# Patient Record
Sex: Male | Born: 1985
Health system: Southern US, Community
[De-identification: ages and names within clinical notes are randomized; demographics above are authoritative.]

## PROBLEM LIST (undated history)

## (undated) DIAGNOSIS — K648 Other hemorrhoids: Secondary | ICD-10-CM

## (undated) DIAGNOSIS — M1990 Unspecified osteoarthritis, unspecified site: Secondary | ICD-10-CM

## (undated) DIAGNOSIS — K649 Unspecified hemorrhoids: Secondary | ICD-10-CM

## (undated) HISTORY — PX: WISDOM TOOTH EXTRACTION: SHX21

## (undated) HISTORY — PX: KNEE SURGERY: SHX244

## (undated) HISTORY — PX: KNEE ARTHROSCOPY WITH ANTERIOR CRUCIATE LIGAMENT (ACL) REPAIR: SHX5644

---
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
## 2003-09-24 ENCOUNTER — Ambulatory Visit (HOSPITAL_BASED_OUTPATIENT_CLINIC_OR_DEPARTMENT_OTHER): Admission: RE | Admit: 2003-09-24 | Discharge: 2003-09-24 | Payer: Self-pay | Admitting: Orthopedic Surgery

## 2003-10-03 ENCOUNTER — Encounter: Admission: RE | Admit: 2003-10-03 | Discharge: 2003-12-26 | Payer: Self-pay | Admitting: Sports Medicine

## 2003-10-13 HISTORY — PX: ANTERIOR CRUCIATE LIGAMENT REPAIR: SHX115

## 2005-07-11 ENCOUNTER — Emergency Department (HOSPITAL_COMMUNITY): Admission: EM | Admit: 2005-07-11 | Discharge: 2005-07-11 | Payer: Self-pay | Admitting: Emergency Medicine

## 2006-03-18 ENCOUNTER — Emergency Department (HOSPITAL_COMMUNITY): Admission: EM | Admit: 2006-03-18 | Discharge: 2006-03-19 | Payer: Self-pay | Admitting: Emergency Medicine

## 2008-04-04 ENCOUNTER — Emergency Department (HOSPITAL_COMMUNITY): Admission: EM | Admit: 2008-04-04 | Discharge: 2008-04-04 | Payer: Self-pay | Admitting: Emergency Medicine

## 2009-10-24 ENCOUNTER — Encounter: Admission: RE | Admit: 2009-10-24 | Discharge: 2009-10-24 | Payer: Self-pay | Admitting: Internal Medicine

## 2011-02-27 NOTE — Op Note (Signed)
NAME:  Connor Castillo, THIER                    ACCOUNT NO.:  [id number]   MEDICAL RECORD NO.:  [id number]                   PATIENT TYPE:  AMB   LOCATION:  DSC                                  FACILITY:  [facility]   PHYSICIAN:  Robert A. Thurston Hole, M.D.              DATE OF BIRTH:  [birth year]   DATE OF PROCEDURE:  09/24/2003  DATE OF DISCHARGE:                                 OPERATIVE REPORT   PREOPERATIVE DIAGNOSIS:  Left knee anterior cruciate ligament tear and  medial meniscus tear.   POSTOPERATIVE DIAGNOSES:  1. Left knee anterior cruciate ligament tear and medial meniscus tear.  2. Left knee partial lateral meniscus tear.   PROCEDURE:  1. Left knee exam under anesthesia, followed  by arthroscopically assisted     endoscopic bone, patellar bone autograft ACL reconstruction using 9 x 25     femoral interference bio screw and 9 x 25 mm tibial interference bio     screw.  2. Left knee partial medial and lateral meniscectomies.   SURGEON:  Elana Alm. Thurston Hole, M.D.   ASSISTANT:  Julien Girt, P.A.   ANESTHESIA:  General.   OPERATIVE TIME:  1 hour and 15 minutes.   COMPLICATIONS:  None.   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 He has had persistent pain and laxity with examination  and MRI documenting an ACL tear and a meniscus tear and is now to undergo  arthroscopy with ACL reconstruction.   DESCRIPTION OF PROCEDURE:  The patient was brought to the operating room on  September 24, 2003, and placed on the operating table in the supine position.  After an adequate level of general anesthesia his left knee was examined  under anesthesia. He had full range of motion, 3+ Lachman, positive pivot  shift and he was stable to varus valgus and posterior stress with normal  patella tracking. The left leg was sterilely injected with 0.25% Marcaine  with  epinephrine. He received 1 gm Ancef IV preoperatively for prophylaxis.  The left leg was prepped using sterile Duraprep and draped using sterile  technique.   Initially the arthroscopy was performed through an inferolateral portal. The  arthroscope with a pump attachment was placed, and through and inferomedial  portal, an arthroscopic probe was placed. On initial inspection of the  medial compartment,  the articular cartilage was normal. The medial meniscus  showed a split loose tear of the posterior  horn in the medial meniscus  which was resected and 50% of the posterior  horn was resected back to a  stable rim.   The intercondylar notch was inspected and the anterior cruciate ligament was  completely torn in its proximal third with significant anterior laxity, and  this was thoroughly debrided and a notchplasty was performed. The posterior  cruciate was intact and stable. The lateral  compartment articular cartilage  was normal. The lateral meniscus showed a small tear posterior  horn 20%  which was resected back to a stable rim. The patellofemoral joint  was  inspected. The articular cartilage in this joint was normal. The patella  tracked normally. The medial and lateral gutters were free of pathology.   After this was done and the  patellar tendon autograft was harvested through  a 5-cm longitudinal incision based over the patellar tendon. Underlying  subcutaneous tissues were incised in line with the skin incision. The  patellar tendon was exposed. It measured 35 mm in width and the central 10  mm was harvested with 10 x 25 mm of  patella bone and tibial tubercle bone.   After  this was done, then through a 1.5-cm anterior medial proximal tibial  incision using a tibial drill guide, a Steinmann pin was drilled into the  ACL insertion point on the tibial plateau and then overdrilled with an 11-mm  drill. Through this hole the posterior  femoral guide was placed into the   posterior femoral notch and a Steinmann pin drilled up in the ACL origin  point and then  overdrilled with a 10-mm drill to a depth of 30 mm.   A double pin passer was then used to be passed through the tibial hole,  through the joint, into the femoral tunnel and  through the femoral cortex  and thigh through a stab wound. This was used to pass the ACL graft up  through the tibial tunnel and joint into the femoral tunnel. It was locked  into position there with a 9 x  25 mm interference bio screw.   The knee was then brought through a full range of motion. There was found to  be no impingement in the graft. The tibial bone plug was then  locked into  its tunnel with a  9 x 25  mm interference bio screw with the knee in 30 degrees of flexion and  the tibia held reduced on the femur. After this was done the knee was tested  for stability. Lachman and pivot shift were found to be totally eliminated  and the knee could be brought through a full range of motion with no  impingement of the graft.   At this point it was felt that all the pathology had been satisfactorily  addressed. The wounds were irrigated and closed using 2-0 Vicryl and 3-0  Prolene. Steri-Strips were applied. Sterile dressings were applied in a long  leg splint. The patient then had a femoral nerve block placed by anesthesia  for postoperative pain control. He was then awakened, extubated and taken to  the recovery room in stable condition. All sponge, instrument and needle  counts were correct at the end of the case.   For follow up care he will be followed as an overnight recovery care patient  for IV pain control and neurovascular monitoring, CPM and ice machine use.  He will be discharged tomorrow on Percocet  and Naprosyn with a home CPM and  ice machine. He will be seen back in my office in one week for sutures out  and follow up.                                              Robert A. Thurston Hole, M.D.    RAW/MEDQ  D:  09/24/2003  T:  09/24/2003  Job:  [id number]

## 2011-02-27 NOTE — Op Note (Signed)
NAME:  Connor Castillo, Connor Castillo                    ACCOUNT NO.:  [id number]   MEDICAL RECORD NO.:  [id number]                   PATIENT TYPE:  AMB   LOCATION:  DSC                                  FACILITY:  [facility]   PHYSICIAN:  Robert A. Thurston Hole, M.D.              DATE OF BIRTH:  09/26/1986   DATE OF PROCEDURE:  09/24/2003  DATE OF DISCHARGE:                                 OPERATIVE REPORT   PREOPERATIVE DIAGNOSIS:  Left knee anterior cruciate ligament tear with  medial meniscal tear.   POSTOPERATIVE DIAGNOSIS:  Left knee anterior cruciate ligament tear with  medial meniscal tear.   PROCEDURE:  1. Left knee examination under anesthesia followed by arthroscopically     assisted endoscopic bone patellar tendon bone Autograft and anterior     cruciate ligament reconstruction using 9 x 25 mm femoral interference bio-     screw and 9 x 25 mm tibial interference bio-screw.  2. Left knee partial medial meniscectomy.   SURGEON:  Elana Alm. Thurston Hole, M.D.   ASSISTANT:  Julien Girt, P.A.   ANESTHESIA:  General.   OPERATIVE TIME:  1 hour and 15 minutes.   COMPLICATIONS:  None.   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  He has had persistent pain, swelling and instability and  he is now to undergo arthroscopy with ACL reconstruction.   DESCRIPTION OF PROCEDURE:  The patient was brought to the operating room on  September 24, 2003 and placed on the operative table in the supine position.  After an adequate level of general anesthesia was obtained, his left knee  was examined under anesthesia.   DICTATION ENDED AT THIS POINT                                               Robert A. Thurston Hole, M.D.    RAW/MEDQ  D:  09/24/2003  T:  09/24/2003  Job:  [id number]

## 2011-04-20 ENCOUNTER — Emergency Department (HOSPITAL_COMMUNITY): Payer: 59

## 2011-04-20 ENCOUNTER — Emergency Department (HOSPITAL_COMMUNITY)
Admission: EM | Admit: 2011-04-20 | Discharge: 2011-04-20 | Disposition: A | Discharge status: Home / Self Care | Payer: 59 | Attending: Emergency Medicine | Admitting: Emergency Medicine

## 2011-04-20 DIAGNOSIS — R079 Chest pain, unspecified: Secondary | ICD-10-CM | POA: Insufficient documentation

## 2011-04-20 LAB — POCT I-STAT, CHEM 8
BUN: 13 mg/dL (ref 6–23)
Calcium, Ion: 1.16 mmol/L (ref 1.12–1.32)
Chloride: 101 mEq/L (ref 96–112)
Creatinine, Ser: 1.3 mg/dL (ref 0.50–1.35)
Glucose, Bld: 90 mg/dL (ref 70–99)
HCT: 42 % (ref 39.0–52.0)
Hemoglobin: 14.3 g/dL (ref 13.0–17.0)
Potassium: 3.8 mEq/L (ref 3.5–5.1)
Sodium: 140 mEq/L (ref 135–145)
TCO2: 27 mmol/L (ref 0–100)

## 2011-04-20 LAB — CBC
HCT: 38.6 % — ABNORMAL LOW (ref 39.0–52.0)
Hemoglobin: 13.3 g/dL (ref 13.0–17.0)
MCH: 29.1 pg (ref 26.0–34.0)
MCHC: 34.5 g/dL (ref 30.0–36.0)
MCV: 84.5 fL (ref 78.0–100.0)
Platelets: 271 10*3/uL (ref 150–400)
RBC: 4.57 MIL/uL (ref 4.22–5.81)
RDW: 12.2 % (ref 11.5–15.5)
WBC: 5.2 10*3/uL (ref 4.0–10.5)

## 2011-04-20 LAB — CK TOTAL AND CKMB (NOT AT ARMC)
CK, MB: 1.6 ng/mL (ref 0.3–4.0)
Relative Index: 1.3 (ref 0.0–2.5)
Total CK: 121 U/L (ref 7–232)

## 2011-04-20 LAB — DIFFERENTIAL
Basophils Absolute: 0.1 10*3/uL (ref 0.0–0.1)
Basophils Relative: 2 % — ABNORMAL HIGH (ref 0–1)
Eosinophils Absolute: 0.2 10*3/uL (ref 0.0–0.7)
Eosinophils Relative: 3 % (ref 0–5)
Lymphocytes Relative: 32 % (ref 12–46)
Lymphs Abs: 1.7 10*3/uL (ref 0.7–4.0)
Monocytes Absolute: 0.6 10*3/uL (ref 0.1–1.0)
Monocytes Relative: 12 % (ref 3–12)
Neutro Abs: 2.7 10*3/uL (ref 1.7–7.7)
Neutrophils Relative %: 52 % (ref 43–77)

## 2011-04-20 LAB — TROPONIN I: Troponin I: <0.3 ng/mL (ref <0.30)

## 2012-11-08 ENCOUNTER — Emergency Department (HOSPITAL_COMMUNITY)
Admission: EM | Admit: 2012-11-08 | Discharge: 2012-11-08 | Disposition: A | Discharge status: Home / Self Care | Payer: Worker's Compensation | Attending: Emergency Medicine | Admitting: Emergency Medicine

## 2012-11-08 ENCOUNTER — Encounter (HOSPITAL_COMMUNITY): Payer: Self-pay | Admitting: *Deleted

## 2012-11-08 DIAGNOSIS — Z23 Encounter for immunization: Secondary | ICD-10-CM | POA: Insufficient documentation

## 2012-11-08 DIAGNOSIS — W2209XA Striking against other stationary object, initial encounter: Secondary | ICD-10-CM | POA: Insufficient documentation

## 2012-11-08 DIAGNOSIS — Y9389 Activity, other specified: Secondary | ICD-10-CM | POA: Insufficient documentation

## 2012-11-08 DIAGNOSIS — Y990 Civilian activity done for income or pay: Secondary | ICD-10-CM | POA: Insufficient documentation

## 2012-11-08 DIAGNOSIS — S01511A Laceration without foreign body of lip, initial encounter: Secondary | ICD-10-CM

## 2012-11-08 DIAGNOSIS — Y9289 Other specified places as the place of occurrence of the external cause: Secondary | ICD-10-CM | POA: Insufficient documentation

## 2012-11-08 DIAGNOSIS — S01501A Unspecified open wound of lip, initial encounter: Secondary | ICD-10-CM | POA: Insufficient documentation

## 2012-11-08 MED ORDER — TETANUS-DIPHTH-ACELL PERTUSSIS 5-2.5-18.5 LF-MCG/0.5 IM SUSP
0.5000 mL | Freq: Once | INTRAMUSCULAR | Status: AC
  Administered 2012-11-08: 0.5 mL via INTRAMUSCULAR
  Filled 2012-11-08: qty 0.5

## 2012-11-08 MED ORDER — ONDANSETRON HCL 4 MG/2ML IJ SOLN
4.0000 mg | Freq: Once | INTRAMUSCULAR | Status: AC
  Administered 2012-11-08: 4 mg via INTRAVENOUS
  Filled 2012-11-08: qty 2

## 2012-11-08 MED ORDER — LIDOCAINE-EPINEPHRINE (PF) 2 %-1:200000 IJ SOLN: 20.0000 mL | Freq: Once | INTRAMUSCULAR | Status: DC

## 2012-11-08 MED ORDER — HYDROMORPHONE HCL PF 1 MG/ML IJ SOLN
1.0000 mg | Freq: Once | INTRAMUSCULAR | Status: AC
  Administered 2012-11-08: 1 mg via INTRAVENOUS
  Filled 2012-11-08: qty 1

## 2012-11-08 MED ORDER — LIDOCAINE-EPINEPHRINE 1 %-1:100000 IJ SOLN
20.0000 mL | Freq: Once | INTRAMUSCULAR | Status: DC
  Filled 2012-11-08: qty 1

## 2012-11-08 MED ORDER — BACITRACIN 500 UNIT/GM EX OINT
1.0000 "application " | TOPICAL_OINTMENT | Freq: Two times a day (BID) | CUTANEOUS | Status: DC
  Filled 2012-11-08 (×2): qty 0.9

## 2012-11-08 NOTE — ED Notes (Signed)
Care transferred and report given to Kelly, RN 

## 2012-11-08 NOTE — ED Provider Notes (Signed)
History     CSN: [id number]  Arrival date & time 11/08/12  0919   First MD Initiated Contact with Patient 11/08/12 [phone]      Chief Complaint  Patient presents with  . Facial Laceration    (Consider location/radiation/quality/duration/timing/severity/associated sxs/prior treatment) HPI  Connor Castillo is a 27 y.o. male complaining of pain and laceration to left upper lip onset this a.m. after being hit with a height at work. Last tetanus unknown, bleeding is now controlled, pain is 6/10, denies tooth pain, difficulty breathing  History reviewed. No pertinent past medical history.  Past Surgical History  Procedure Date  . Knee surgery     No family history on file.  History  Substance Use Topics  . Smoking status: Never Smoker   . Smokeless tobacco: Not on file  . Alcohol Use: No      Review of Systems  Constitutional: Negative for fever.  Respiratory: Negative for shortness of breath.   Cardiovascular: Negative for chest pain.  Gastrointestinal: Negative for nausea, vomiting, abdominal pain and diarrhea.  Skin: Positive for wound.  All other systems reviewed and are negative.    Allergies  Review of patient's allergies indicates no known allergies.  Home Medications  No current outpatient prescriptions on file.  BP 123/68  Pulse 100  Resp 26  Ht 5\' 10"  (1.778 m)  Wt 235 lb (106.595 kg)  BMI 33.72 kg/m2  SpO2 98%  Physical Exam  Nursing note and vitals reviewed. Constitutional: He is oriented to person, place, and time. He appears well-developed and well-nourished. No distress.  HENT:  Head: Normocephalic and atraumatic.  Mouth/Throat: Oropharynx is clear and moist.  Eyes: Conjunctivae normal and EOM are normal. Pupils are equal, round, and reactive to light.  Neck: Normal range of motion.  Cardiovascular: Normal rate.   Pulmonary/Chest: Effort normal. No stridor.  Abdominal: Soft.  Musculoskeletal: Normal range of motion.  Neurological: He  is alert and oriented to person, place, and time.  Skin:       2.5 cm full-thickness laceration to left upper lip that transects the [place] border. She also has a half centimeter laceration to the inner lip this is not appear to be a through and through. Bleeding profusely with no contamination.  Psychiatric: He has a normal mood and affect.    ED Course  Procedures (including critical care time)  Labs Reviewed - No data to display No results found.   1. Lip laceration       MDM  Patient specifically requests a plastic surgeon close the wound. I explained to him that we do not have plastic surgery on-call but [facility] is an option. Patient would like the wound to be closed by surgeon. I will update his tetanus, irrigate the wound.   Consult from Dr. Estella Husk appreciated: He will be happy to evaluate the patient in his office today.  Patient given directions to Dr. Lorine Bears office and advised him to go immediately from the emergency room there. Patient has a friend who can drive him.   Pt verbalized understanding and agrees with care plan. Outpatient follow-up and return precautions given.         Wynetta Emery, PA-C 11/08/12 [phone]

## 2012-11-08 NOTE — ED Notes (Signed)
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 Laceration to left upper lip, approximately 2 inches.

## 2012-11-09 NOTE — ED Provider Notes (Signed)
Medical screening examination/treatment/procedure(s) were performed by non-physician practitioner and as supervising physician I was immediately available for consultation/collaboration.   Gwyneth Sprout, MD 11/09/12 1016

## 2013-11-18 ENCOUNTER — Emergency Department (HOSPITAL_COMMUNITY)
Admission: EM | Admit: 2013-11-18 | Discharge: 2013-11-19 | Disposition: A | Discharge status: Home / Self Care | Payer: 59 | Attending: Emergency Medicine | Admitting: Emergency Medicine

## 2013-11-18 ENCOUNTER — Encounter (HOSPITAL_COMMUNITY): Payer: Self-pay | Admitting: Emergency Medicine

## 2013-11-18 DIAGNOSIS — R5383 Other fatigue: Secondary | ICD-10-CM

## 2013-11-18 DIAGNOSIS — R5381 Other malaise: Secondary | ICD-10-CM | POA: Insufficient documentation

## 2013-11-18 DIAGNOSIS — R197 Diarrhea, unspecified: Secondary | ICD-10-CM | POA: Insufficient documentation

## 2013-11-18 DIAGNOSIS — R509 Fever, unspecified: Secondary | ICD-10-CM | POA: Insufficient documentation

## 2013-11-18 DIAGNOSIS — R112 Nausea with vomiting, unspecified: Secondary | ICD-10-CM | POA: Insufficient documentation

## 2013-11-18 DIAGNOSIS — R109 Unspecified abdominal pain: Secondary | ICD-10-CM

## 2013-11-18 DIAGNOSIS — E871 Hypo-osmolality and hyponatremia: Secondary | ICD-10-CM | POA: Insufficient documentation

## 2013-11-18 DIAGNOSIS — R1084 Generalized abdominal pain: Secondary | ICD-10-CM | POA: Insufficient documentation

## 2013-11-18 LAB — COMPREHENSIVE METABOLIC PANEL
ALK PHOS: 47 U/L (ref 39–117)
ALT: 17 U/L (ref 0–53)
AST: 19 U/L (ref 0–37)
Albumin: 4.1 g/dL (ref 3.5–5.2)
BUN: 16 mg/dL (ref 6–23)
CALCIUM: 8.8 mg/dL (ref 8.4–10.5)
CO2: 26 meq/L (ref 19–32)
Chloride: 98 mEq/L (ref 96–112)
Creatinine, Ser: 1.27 mg/dL (ref 0.50–1.35)
GFR, EST AFRICAN AMERICAN: 87 mL/min — AB (ref >90)
GFR, EST NON AFRICAN AMERICAN: 75 mL/min — AB (ref >90)
GLUCOSE: 117 mg/dL — AB (ref 70–99)
Potassium: 4 mEq/L (ref 3.7–5.3)
SODIUM: 136 meq/L — AB (ref 137–147)
TOTAL PROTEIN: 7.6 g/dL (ref 6.0–8.3)
Total Bilirubin: 1.1 mg/dL (ref 0.3–1.2)

## 2013-11-18 LAB — CBC WITH DIFFERENTIAL/PLATELET
Basophils Absolute: 0 K/uL (ref 0.0–0.1)
Basophils Relative: 0 % (ref 0–1)
Eosinophils Absolute: 0.1 K/uL (ref 0.0–0.7)
Eosinophils Relative: 1 % (ref 0–5)
HCT: 45.6 % (ref 39.0–52.0)
Hemoglobin: 15.7 g/dL (ref 13.0–17.0)
Lymphocytes Relative: 7 % — ABNORMAL LOW (ref 12–46)
Lymphs Abs: 0.7 K/uL (ref 0.7–4.0)
MCH: 29.4 pg (ref 26.0–34.0)
MCHC: 34.4 g/dL (ref 30.0–36.0)
MCV: 85.4 fL (ref 78.0–100.0)
Monocytes Absolute: 0.7 K/uL (ref 0.1–1.0)
Monocytes Relative: 7 % (ref 3–12)
Neutro Abs: 8.5 K/uL — ABNORMAL HIGH (ref 1.7–7.7)
Neutrophils Relative %: 85 % — ABNORMAL HIGH (ref 43–77)
Platelets: ADEQUATE K/uL (ref 150–400)
RBC: 5.34 MIL/uL (ref 4.22–5.81)
RDW: 12.9 % (ref 11.5–15.5)
WBC: 10 K/uL (ref 4.0–10.5)

## 2013-11-18 LAB — LIPASE, BLOOD: LIPASE: 18 U/L (ref 11–59)

## 2013-11-18 MED ORDER — CIPROFLOXACIN HCL 500 MG PO TABS: 500.0000 mg | ORAL_TABLET | Freq: Two times a day (BID) | ORAL | Status: DC

## 2013-11-18 MED ORDER — SODIUM CHLORIDE 0.9 % IV BOLUS (SEPSIS)
1000.0000 mL | Freq: Once | INTRAVENOUS | Status: AC
  Administered 2013-11-18: 1000 mL via INTRAVENOUS

## 2013-11-18 MED ORDER — METRONIDAZOLE 500 MG PO TABS: 500.0000 mg | ORAL_TABLET | Freq: Two times a day (BID) | ORAL | Status: DC

## 2013-11-18 MED ORDER — ONDANSETRON HCL 4 MG/2ML IJ SOLN
4.0000 mg | Freq: Once | INTRAMUSCULAR | Status: AC
  Administered 2013-11-18: 4 mg via INTRAVENOUS
  Filled 2013-11-18: qty 2

## 2013-11-18 MED ORDER — MORPHINE SULFATE 4 MG/ML IJ SOLN
4.0000 mg | Freq: Once | INTRAMUSCULAR | Status: AC
  Administered 2013-11-19: 4 mg via INTRAVENOUS
  Filled 2013-11-18: qty 1

## 2013-11-18 MED ORDER — MORPHINE SULFATE 4 MG/ML IJ SOLN
4.0000 mg | Freq: Once | INTRAMUSCULAR | Status: AC
  Administered 2013-11-18: 4 mg via INTRAVENOUS
  Filled 2013-11-18: qty 1

## 2013-11-18 MED ORDER — ONDANSETRON HCL 4 MG/2ML IJ SOLN
4.0000 mg | Freq: Once | INTRAMUSCULAR | Status: AC
Start: 2013-11-18 — End: 2013-11-19
  Administered 2013-11-19: 4 mg via INTRAVENOUS
  Filled 2013-11-18: qty 2

## 2013-11-18 MED ORDER — PROMETHAZINE HCL 25 MG PO TABS: 25.0000 mg | ORAL_TABLET | Freq: Four times a day (QID) | ORAL | Status: DC | PRN

## 2013-11-18 NOTE — ED Notes (Signed)
Pt arrived to the Ed with a complaint of abdominal pain with associated nausea and vomiting.  Pt states he believes it is food poisoning due to his brother falling ill with the same symptoms as [facility]  Pt pain is located all over his abdomen. Pt used a hot pad with some relief but the pain has persisted.

## 2013-11-18 NOTE — ED Provider Notes (Signed)
Medical screening examination/treatment/procedure(s) were conducted as a shared visit with non-physician practitioner(s) and myself.  I personally evaluated the patient during the encounter.  EKG Interpretation   None      Patient here after having abdominal pain with vomiting [facility] Patient yesterday. No bloody diarrhea. Blood work here is reassuring. All exam is without focal tenderness. Does have a low-grade temperature here and possibly has colitis and will be given prescriptions for treatment of this. No evidence of surgical abdomen at time of discharge  Toy BakerAnthony T Colvin Blatt, MD 11/18/13 2316

## 2013-11-18 NOTE — ED Provider Notes (Signed)
CSN: [id number]     Arrival date & time 11/18/13  2028 History   First MD Initiated Contact with Patient 11/18/13 2151     Chief Complaint  Patient presents with  . Abdominal Pain   (Consider location/radiation/quality/duration/timing/severity/associated sxs/prior Treatment) Patient is a 28 y.o. male presenting with abdominal pain.  Abdominal Pain  28 yo male presents with generalized abdominal pain x 2 days with associated N/V/D. Patient states he ate at [facility] yesterday around 3 pm and then developed abdominal pain about an hour later. Patient describes pain as crampy and sharp intermittent 7-8/10 pain that waxes and wanes. Patient admits to 5 episodes of N/V and about 4 episodes of diarrhea since yesterday. Patient now states he is feeling weak and fatigued. Patient has tried peptobismol without relief. Patient admits to some blood specs in his vomit but denies any bloody stools. Patient denies any urinary sxs. No hx of abdominal surgery. Patient does not smoke or drink alcohol.  History reviewed. No pertinent past medical history. Past Surgical History  Procedure Laterality Date  . Knee surgery     History reviewed. No pertinent family history. History  Substance Use Topics  . Smoking status: Never Smoker   . Smokeless tobacco: Not on file  . Alcohol Use: No    Review of Systems  Gastrointestinal: Positive for abdominal pain.  All other systems reviewed and are negative.    Allergies  Review of patient's allergies indicates no known allergies.  Home Medications   Current Outpatient Rx  Name  Route  Sig  Dispense  Refill  . bismuth subsalicylate (PEPTO BISMOL) 262 MG/15ML suspension   Oral   Take 30 mLs by mouth every 6 (six) hours as needed (upstomach).         . ciprofloxacin (CIPRO) 500 MG tablet   Oral   Take 1 tablet (500 mg total) by mouth 2 (two) times daily. Duration of 5 days   10 tablet   0   . metroNIDAZOLE (FLAGYL) 500 MG tablet   Oral   Take 1  tablet (500 mg total) by mouth 2 (two) times daily. One po bid x 5 days   10 tablet   0   . promethazine (PHENERGAN) 25 MG tablet   Oral   Take 1 tablet (25 mg total) by mouth every 6 (six) hours as needed for nausea or vomiting.   12 tablet   0    BP 115/53  Pulse 75  Temp(Src) 98 F (36.7 C) (Oral)  Resp 15  SpO2 97% Physical Exam  Nursing note and vitals reviewed. Constitutional: He is oriented to person, place, and time. He appears well-developed and well-nourished. No distress.  HENT:  Head: Normocephalic and atraumatic.  Nose: Mucosal edema present.  Mouth/Throat: Uvula is midline, oropharynx is clear and moist and mucous membranes are normal.  Eyes: Conjunctivae and EOM are normal.  Cardiovascular: Normal rate and regular rhythm.  Exam reveals no gallop and no friction rub.   No murmur heard. Pulmonary/Chest: Effort normal and breath sounds normal. No respiratory distress. He has no wheezes. He has no rales.  Abdominal: Soft. Normal appearance. He exhibits no distension. Bowel sounds are increased. There is generalized tenderness. There is no rigidity and no guarding.  Musculoskeletal: Normal range of motion. He exhibits no edema.  Neurological: He is alert and oriented to person, place, and time.  Skin: Skin is warm and dry. He is not diaphoretic.  Psychiatric: He has a normal mood and affect. His  behavior is normal.    ED Course  Procedures (including critical care time) Labs Review Labs Reviewed  CBC WITH DIFFERENTIAL - Abnormal; Notable for the following:    Neutrophils Relative % 85 (*)    Lymphocytes Relative 7 (*)    Neutro Abs 8.5 (*)    All other components within normal limits  COMPREHENSIVE METABOLIC PANEL - Abnormal; Notable for the following:    Sodium 136 (*)    Glucose, Bld 117 (*)    GFR calc non Af Amer 75 (*)    GFR calc Af Amer 87 (*)    All other components within normal limits  LIPASE, BLOOD   Imaging Review No results found.  EKG  Interpretation   None       MDM   1. Abdominal pain   2. Nausea, vomiting, and diarrhea   3. Hyponatremia   4. Low grade fever    Patient with low grade fever of 100.1 and mild tachycardia on presentation. Appears in NAD. Non surgical abdomen on exam. No Leukocytosis Lipase negative  Plan to treat patient's sxs in ED and have patient start outpatient antibiotics for possible colitis, as patient presents with low grade fever and tachycardia.   Patient's pain and nausea markedly improved with treatment in ED. Patient appears stable for discharge. Discussed findings with patient. Patient confirms understanding and agrees with plan.   Meds given in ED:  Medications  sodium chloride 0.9 % bolus 1,000 mL (1,000 mLs Intravenous New Bag/Given 11/18/13 2254)  morphine 4 MG/ML injection 4 mg (4 mg Intravenous Given 11/18/13 2254)  ondansetron (ZOFRAN) injection 4 mg (4 mg Intravenous Given 11/18/13 2254)  morphine 4 MG/ML injection 4 mg (4 mg Intravenous Given 11/19/13 0002)  ondansetron (ZOFRAN) injection 4 mg (4 mg Intravenous Given 11/19/13 0002)    Discharge Medication List as of 11/18/2013 11:32 PM    START taking these medications   Details  ciprofloxacin (CIPRO) 500 MG tablet Take 1 tablet (500 mg total) by mouth 2 (two) times daily. Duration of 5 days, Starting 11/18/2013, Until Discontinued, Print    metroNIDAZOLE (FLAGYL) 500 MG tablet Take 1 tablet (500 mg total) by mouth 2 (two) times daily. One po bid x 5 days, Starting 11/18/2013, Until Discontinued, Print    promethazine (PHENERGAN) 25 MG tablet Take 1 tablet (25 mg total) by mouth every 6 (six) hours as needed for nausea or vomiting., Starting 11/18/2013, Until Discontinued, Print            Allen NorrisJacob Gray Waite HillLackey, PA-C 11/20/13 [phone]

## 2013-11-18 NOTE — Discharge Instructions (Signed)
Take medications as directed. Do not consume alcohol while on antibiotic medications. Drink plenty of fluids to avoid dehydration. Return to ED if your symptoms worsen, you develop fever/chills or become unable to tolerate oral fluids.    Abdominal Pain, Adult Many things can cause abdominal pain. Usually, abdominal pain is not caused by a disease and will improve without treatment. It can often be observed and treated at home. Your health care provider will do a physical exam and possibly order blood tests and X-rays to help determine the seriousness of your pain. However, in many cases, more time must pass before a clear cause of the pain can be found. Before that point, your health care provider may not know if you need more testing or further treatment. HOME CARE INSTRUCTIONS  Monitor your abdominal pain for any changes. The following actions may help to alleviate any discomfort you are experiencing:  Only take over-the-counter or prescription medicines as directed by your health care provider.  Do not take laxatives unless directed to do so by your health care provider.  Try a clear liquid diet (broth, tea, or water) as directed by your health care provider. Slowly move to a bland diet as tolerated. SEEK MEDICAL CARE IF:  You have unexplained abdominal pain.  You have abdominal pain associated with nausea or diarrhea.  You have pain when you urinate or have a bowel movement.  You experience abdominal pain that wakes you in the night.  You have abdominal pain that is worsened or improved by eating food.  You have abdominal pain that is worsened with eating fatty foods. SEEK IMMEDIATE MEDICAL CARE IF:   Your pain does not go away within 2 hours.  You have a fever.  You keep throwing up (vomiting).  Your pain is felt only in portions of the abdomen, such as the right side or the left lower portion of the abdomen.  You pass bloody or black tarry stools. MAKE SURE  YOU:  Understand these instructions.   Will watch your condition.   Will get help right away if you are not doing well or get worse.  Document Released: 07/08/2005 Document Revised: 07/19/2013 Document Reviewed: 06/07/2013 [facility] [employer] Patient Information 2014 [employer] [place].

## 2013-11-22 NOTE — ED Provider Notes (Signed)
Medical screening examination/treatment/procedure(s) were performed by non-physician practitioner and as supervising physician I was immediately available for consultation/collaboration.  EKG Interpretation   None        Toy BakerAnthony T Harris Penton, MD 11/22/13 1108

## 2014-06-04 ENCOUNTER — Other Ambulatory Visit: Payer: Self-pay | Admitting: Family

## 2014-06-04 ENCOUNTER — Ambulatory Visit
Admission: RE | Admit: 2014-06-04 | Discharge: 2014-06-04 | Disposition: A | Discharge status: Home / Self Care | Payer: Worker's Compensation | Source: Ambulatory Visit | Attending: Family | Admitting: Family

## 2014-06-04 DIAGNOSIS — T148XXA Other injury of unspecified body region, initial encounter: Secondary | ICD-10-CM

## 2014-06-04 DIAGNOSIS — R52 Pain, unspecified: Secondary | ICD-10-CM

## 2015-01-09 ENCOUNTER — Encounter: Payer: Self-pay | Admitting: Family Medicine

## 2015-01-23 ENCOUNTER — Ambulatory Visit (INDEPENDENT_AMBULATORY_CARE_PROVIDER_SITE_OTHER): Payer: 59 | Admitting: Physician Assistant

## 2015-01-23 ENCOUNTER — Encounter: Payer: Self-pay | Admitting: Physician Assistant

## 2015-01-23 ENCOUNTER — Other Ambulatory Visit: Payer: Self-pay | Admitting: Physician Assistant

## 2015-01-23 VITALS — BP 118/82 | HR 72 | Temp 99.0°F | Resp 18 | Ht 68.5 in | Wt 232.0 lb

## 2015-01-23 DIAGNOSIS — Z0000 Encounter for general adult medical examination without abnormal findings: Secondary | ICD-10-CM

## 2015-01-23 NOTE — Progress Notes (Signed)
Patient ID: Connor Castillo MRN: [id number], DOB: [birth year] 28 y.o. Date of Encounter: 01/23/2015, 3:35 PM    Chief Complaint: Physical (CPE)  HPI: 29 y.o. y/o AA male here for CPE.   Also, he is being seen as a new patient to establish care at our office. Says his grandmother, Donah Driverarnestine Graves, is a patient here at our office.  He has no complaints or concerns today. States that he has only gone to medical provider for acute illnesses and has never gone for checkup/complete physical exam in many years. Also reports that he has not been diagnosed with any chronicle medical problems.   Review of Systems: Consitutional: No fever, chills, fatigue, night sweats, lymphadenopathy, or weight changes. Eyes: No visual changes, eye redness, or discharge. ENT/Mouth: Ears: No otalgia, tinnitus, hearing loss, discharge. Nose: No congestion, rhinorrhea, sinus pain, or epistaxis. Throat: No sore throat, post nasal drip, or teeth pain. Cardiovascular: No CP, palpitations, diaphoresis, DOE, edema, orthopnea, PND. Respiratory: No cough, hemoptysis, SOB, or wheezing. Gastrointestinal: No anorexia, dysphagia, reflux, pain, nausea, vomiting, hematemesis, diarrhea, constipation, BRBPR, or melena. Genitourinary: No dysuria, frequency, urgency, hematuria, incontinence, nocturia, decreased urinary stream, discharge, impotence, or testicular pain/masses. Musculoskeletal: No decreased ROM, myalgias, stiffness, joint swelling, or weakness. Skin: No rash, erythema, lesion changes, pain, warmth, jaundice, or pruritis. Neurological: No headache, dizziness, syncope, seizures, tremors, memory loss, coordination problems, or paresthesias. Psychological: No anxiety, depression, hallucinations, SI/HI. Endocrine: No fatigue, polydipsia, polyphagia, polyuria, or known diabetes. All other systems were reviewed and are otherwise negative.  History reviewed. No pertinent past medical history.   Past Surgical  History  Procedure Laterality Date  . Knee surgery    . Anterior cruciate ligament repair  2005    Home Meds:  Outpatient Prescriptions Prior to Visit  Medication Sig Dispense Refill  . bismuth subsalicylate (PEPTO BISMOL) 262 MG/15ML suspension Take 30 mLs by mouth every 6 (six) hours as needed (upstomach).    . ciprofloxacin (CIPRO) 500 MG tablet Take 1 tablet (500 mg total) by mouth 2 (two) times daily. Duration of 5 days 10 tablet 0  . metroNIDAZOLE (FLAGYL) 500 MG tablet Take 1 tablet (500 mg total) by mouth 2 (two) times daily. One po bid x 5 days 10 tablet 0  . promethazine (PHENERGAN) 25 MG tablet Take 1 tablet (25 mg total) by mouth every 6 (six) hours as needed for nausea or vomiting. 12 tablet 0   No facility-administered medications prior to visit.    Allergies: No Known Allergies  History   Social History  . Marital Status: Married    Spouse Name: N/A  . Number of Children: N/A  . Years of Education: N/A   Occupational History  . Not on file.   Social History Main Topics  . Smoking status: Never Smoker   . Smokeless tobacco: Never Used  . Alcohol Use: 3.0 oz/week    1 Glasses of wine, 2 Cans of beer, 2 Shots of liquor per week     Comment: per month consumption  . Drug Use: No  . Sexual Activity: Yes   Other Topics Concern  . Not on file   Social History Narrative   Entered 01/23/2015:      Married. Lives with his wife.   Has no children.   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   Is off Friday Saturday Sunday.  Family History  Problem Relation Age of Onset  . Diabetes Mother   . Hypertension Mother     Physical Exam: Blood pressure 118/82, pulse 72, temperature 99 F (37.2 C), temperature source Oral, resp. rate 18, height 5' 8.5" (1.74 m), weight 232 lb (105.235 kg).  General: Well developed, well nourished, AAM. Appears in no  acute distress. HEENT: Normocephalic, atraumatic. Conjunctiva pink, sclera non-icteric. Pupils 2 mm constricting to 1 mm, round, regular, and equally reactive to light and accomodation. EOMI. Internal auditory canal clear. TMs with good cone of light and without pathology. Nasal mucosa pink. Nares are without discharge. No sinus tenderness. Oral mucosa pink. Dentition good. Pharynx without exudate.   Neck: Supple. Trachea midline. No thyromegaly. Full ROM. No lymphadenopathy. Lungs: Clear to auscultation bilaterally without wheezes, rales, or rhonchi. Breathing is of normal effort and unlabored. Cardiovascular: RRR with S1 S2. No murmurs, rubs, or gallops. Distal pulses 2+ symmetrically. No carotid or abdominal bruits. Abdomen: Soft, non-tender, non-distended with normoactive bowel sounds. No hepatosplenomegaly or masses. No rebound/guarding. No CVA tenderness. No hernias. Musculoskeletal: Full range of motion and 5/5 strength throughout. Without swelling, atrophy, tenderness, crepitus, or warmth. Skin: Warm and moist without erythema, ecchymosis, wounds, or rash. Neuro: A+Ox3. CN II-XII grossly intact. Moves all extremities spontaneously. Full sensation throughout. Normal gait. DTR 2+ throughout upper and lower extremities.  Psych:  Responds to questions appropriately with a normal affect.   Assessment/Plan:  29 y.o. y/o AA male here for CPE  -1. Visit for preventive health examination  A. Screening Labs: He is not fasting today but can return fasting Friday morning. Return fasting for lab work. - CBC with Differential/Platelet - COMPLETE METABOLIC PANEL WITH GFR - Lipid panel ---future - TSH - Vit D  25 hydroxy (rtn osteoporosis monitoring)   B. Screening For Prostate Cancer: Given that he is African-American, will need to start this screening at age 28  C. Screening For Colorectal Cancer: No indication to start this until age 33   D.  Immunizations: Flu--------------------------N/A Tetanus----------------- is entered in epic. Patient states that he had this just 1-2 years ago because of work injury. 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 Immunization records are in epic.  Signed:   [address] [place], [place]  01/23/2015 3:35 PM

## 2015-01-24 LAB — CBC WITH DIFFERENTIAL/PLATELET
BASOS ABS: 0 10*3/uL (ref 0.0–0.1)
BASOS PCT: 0 % (ref 0–1)
Eosinophils Absolute: 0.1 10*3/uL (ref 0.0–0.7)
Eosinophils Relative: 2 % (ref 0–5)
HCT: 44.9 % (ref 39.0–52.0)
Hemoglobin: 14.8 g/dL (ref 13.0–17.0)
Lymphocytes Relative: 43 % (ref 12–46)
Lymphs Abs: 2.5 10*3/uL (ref 0.7–4.0)
MCH: 28.7 pg (ref 26.0–34.0)
MCHC: 33 g/dL (ref 30.0–36.0)
MCV: 87 fL (ref 78.0–100.0)
MPV: 9.6 fL (ref 8.6–12.4)
Monocytes Absolute: 0.4 10*3/uL (ref 0.1–1.0)
Monocytes Relative: 7 % (ref 3–12)
NEUTROS ABS: 2.7 10*3/uL (ref 1.7–7.7)
NEUTROS PCT: 48 % (ref 43–77)
PLATELETS: 256 10*3/uL (ref 150–400)
RBC: 5.16 MIL/uL (ref 4.22–5.81)
RDW: 13.4 % (ref 11.5–15.5)
WBC: 5.7 10*3/uL (ref 4.0–10.5)

## 2015-01-24 LAB — COMPLETE METABOLIC PANEL WITH GFR
ALT: 24 U/L (ref 0–53)
AST: 22 U/L (ref 0–37)
Albumin: 4.7 g/dL (ref 3.5–5.2)
Alkaline Phosphatase: 53 U/L (ref 39–117)
BUN: 18 mg/dL (ref 6–23)
CALCIUM: 9.5 mg/dL (ref 8.4–10.5)
CO2: 28 mEq/L (ref 19–32)
CREATININE: 1.23 mg/dL (ref 0.50–1.35)
Chloride: 105 mEq/L (ref 96–112)
GFR, Est African American: >89 mL/min
GFR, Est Non African American: 78 mL/min
Glucose, Bld: 91 mg/dL (ref 70–99)
Potassium: 4 mEq/L (ref 3.5–5.3)
Sodium: 139 mEq/L (ref 135–145)
TOTAL PROTEIN: 7.4 g/dL (ref 6.0–8.3)
Total Bilirubin: 0.9 mg/dL (ref 0.2–1.2)

## 2015-01-24 LAB — TSH: TSH: 0.151 u[IU]/mL — ABNORMAL LOW (ref 0.350–4.500)

## 2015-01-24 LAB — T4, FREE: Free T4: 1.13 ng/dL (ref 0.80–1.80)

## 2015-01-24 LAB — VITAMIN D 25 HYDROXY (VIT D DEFICIENCY, FRACTURES): Vit D, 25-Hydroxy: 31 ng/mL (ref 30–100)

## 2015-01-25 ENCOUNTER — Other Ambulatory Visit: Payer: 59

## 2015-01-25 DIAGNOSIS — Z0000 Encounter for general adult medical examination without abnormal findings: Secondary | ICD-10-CM

## 2015-01-25 LAB — LIPID PANEL
CHOL/HDL RATIO: 4.3 ratio
Cholesterol: 192 mg/dL (ref 0–200)
HDL: 45 mg/dL (ref >=40)
LDL Cholesterol: 139 mg/dL — ABNORMAL HIGH (ref 0–99)
Triglycerides: 39 mg/dL (ref <150)
VLDL: 8 mg/dL (ref 0–40)

## 2015-01-29 ENCOUNTER — Other Ambulatory Visit: Payer: Self-pay | Admitting: Family Medicine

## 2015-01-29 DIAGNOSIS — R7989 Other specified abnormal findings of blood chemistry: Secondary | ICD-10-CM

## 2015-02-25 ENCOUNTER — Encounter (HOSPITAL_COMMUNITY): Payer: Self-pay | Admitting: Emergency Medicine

## 2015-02-25 ENCOUNTER — Emergency Department (HOSPITAL_COMMUNITY)
Admission: EM | Admit: 2015-02-25 | Discharge: 2015-02-26 | Disposition: A | Discharge status: Home / Self Care | Payer: 59 | Attending: Emergency Medicine | Admitting: Emergency Medicine

## 2015-02-25 DIAGNOSIS — K648 Other hemorrhoids: Secondary | ICD-10-CM | POA: Diagnosis not present

## 2015-02-25 DIAGNOSIS — K649 Unspecified hemorrhoids: Secondary | ICD-10-CM | POA: Diagnosis present

## 2015-02-25 HISTORY — DX: Unspecified hemorrhoids: K64.9

## 2015-02-25 MED ORDER — LIDOCAINE HCL 2 % EX GEL
1.0000 "application " | Freq: Once | CUTANEOUS | Status: AC
  Administered 2015-02-25: 1 via TOPICAL
  Filled 2015-02-25: qty 20

## 2015-02-25 MED ORDER — DIAZEPAM 5 MG PO TABS
5.0000 mg | ORAL_TABLET | Freq: Once | ORAL | Status: AC
  Administered 2015-02-25: 5 mg via ORAL
  Filled 2015-02-25: qty 1

## 2015-02-25 NOTE — ED Notes (Signed)
Pt. reports painful hemorrhoid onset yesterday unrelieved by OTC Preparation H .

## 2015-02-26 ENCOUNTER — Telehealth: Payer: Self-pay | Admitting: Family Medicine

## 2015-02-26 ENCOUNTER — Encounter: Payer: Self-pay | Admitting: Family Medicine

## 2015-02-26 ENCOUNTER — Ambulatory Visit (INDEPENDENT_AMBULATORY_CARE_PROVIDER_SITE_OTHER): Payer: 59 | Admitting: Family Medicine

## 2015-02-26 VITALS — BP 136/70 | HR 88 | Temp 98.5°F | Resp 16 | Ht 68.5 in | Wt 233.0 lb

## 2015-02-26 DIAGNOSIS — K648 Other hemorrhoids: Secondary | ICD-10-CM

## 2015-02-26 MED ORDER — HYDROCORTISONE 2.5 % RE CREA: 1.0000 "application " | TOPICAL_CREAM | Freq: Two times a day (BID) | RECTAL | Status: DC

## 2015-02-26 MED ORDER — PSYLLIUM 28 % PO PACK: 1.0000 | PACK | Freq: Two times a day (BID) | ORAL | Status: DC

## 2015-02-26 MED ORDER — LIDOCAINE HCL 2 % EX GEL: 1.0000 "application " | Freq: Four times a day (QID) | CUTANEOUS | Status: DC | PRN

## 2015-02-26 NOTE — Progress Notes (Signed)
Patient ID: Connor HaloChristopher N Castillo, male   DOB: [birth year], 29 y.o.   MRN: [id number]   Subjective:    Patient ID: Connor HaloChristopher N Castillo, male    DOB: [birth year], 29 y.o.   MRN: [id number]  Patient presents for Prolapsed Hemorrhoids  patient here with prolapsed hemorrhoids. He was seen in the emergency room yesterday. They did try to reduce the hemorrhoids but they're now back out and quite painful. He has history of hemorrhoids as well as some straining and constipation. They given a prescription for Metamucil as well as lidocaine jelly he has not picked these up. He is unable to work he works for the [place] the [employer] department he has to do a lot of lifting typically 20-50 pounds. He's been doing sitz baths multiple times a day which helps some.    Review Of Systems: PER ABOVE  GEN- denies fatigue, fever, weight loss,weakness, recent illness HEENT- denies eye drainage, change in vision, nasal discharge, CVS- denies chest pain, palpitations RESP- denies SOB, cough, wheeze ABD- denies N/V, change in stools, abd pain GU- denies dysuria, hematuria, dribbling, incontinence MSK- denies joint pain, muscle aches, injury Neuro- denies headache, dizziness, syncope, seizure activity       Objective:    BP 136/70 mmHg  Pulse 88  Temp(Src) 98.5 F (36.9 C) (Oral)  Resp 16  Ht 5' 8.5" (1.74 m)  Wt 233 lb (105.688 kg)  BMI 34.91 kg/m2 GEN- NAD, alert and oriented x3 Rectum- large prolpased internal hemorroids, no active bleeding, TTP, unable to reduce        Assessment & Plan:      Problem List Items Addressed This Visit    None    Visit Diagnoses    Prolapsed internal hemorrhoids    -  Primary    Discussed with surgery, due to severe inflammation no intervention for 2 weeks,out of work, sitz bath, anusol, lidocaine, bowel regimen        Note: This dictation was prepared with Nurse, children'sDragon dictation along with smaller Lobbyistphrase technology. Any transcriptional errors that result  from this process are unintentional.

## 2015-02-26 NOTE — Telephone Encounter (Signed)
Salley ScarletKawanta F Skidmore, MD at 02/26/2015 11:56 AM     Status: Signed       Expand All Collapse All   I called pt, I spoke with Dr. Marykay Lexhomspn at [facility], they do not recommend doing anything surgically to the prolapse hemorroids while inflammed because of risk of anal stenosis, so not to try to reduce them at this time. They will call and schedule him an appt, but he will have to wait 2 weeks, unless he starts having bleeding. Sitz baths three times a day Use the lidocaine jelly like we discussed Also, instead of metamucil pick up Miralax 1 cap full daily to keep bowels soft  I also sent in Anusol steroid cream- they recommend twice a day as well        Pt called and I told him above information.

## 2015-02-26 NOTE — Telephone Encounter (Signed)
I called pt, I spoke with Dr. Marykay Lexhomspn at [facility], they do not recommend doing anything surgically to the prolapse hemorroids while inflammed because of risk of anal stenosis, so not to try to reduce them at this time. They will call and schedule him an appt, but he will have to wait 2 weeks, unless he starts having bleeding. Sitz baths three times a day Use the lidocaine jelly like we discussed Also, instead of metamucil pick up Miralax 1 cap full daily to keep bowels soft  I also sent in Anusol steroid cream- they recommend twice a day as well

## 2015-02-26 NOTE — Patient Instructions (Addendum)
[facility]- appointment to be made Continue sitz baths Get the lidocaine Jelly prescription F/U pending

## 2015-02-26 NOTE — Discharge Instructions (Signed)

## 2015-03-05 ENCOUNTER — Telehealth: Payer: Self-pay | Admitting: Physician Assistant

## 2015-03-05 NOTE — Telephone Encounter (Signed)
[phone] Pt dropped off FMLA ppw to be filled out I have routed it to [place]

## 2015-03-06 NOTE — Telephone Encounter (Signed)
Received FMLA ppw on my desk from [employer] [place]  Pt Job title is Designer, industrial/productCrew Member at [employer]  Work Schedule 10 hour day/4 days week  Pt states he is needing FMLA because he has hemorroids and is not able to work comfortably and is in a lot of pain. States he needs surgery on and his body needs to time to heal up.  Pt saw Dr.Stephen Juleen ChinaKohut on 02/25/15 and has appt scheduled with [facility] on June 1 at 2:50pm.  I have routed the FMLA ppw to Dr. Jeanice Limurham and placed in blue folder and filled in Section III of provider section.

## 2015-03-06 NOTE — Telephone Encounter (Signed)
Is he still out of work at this time

## 2015-03-06 NOTE — Telephone Encounter (Signed)
Yes Ma'am. °

## 2015-03-08 NOTE — Telephone Encounter (Signed)
Pt wife was checking on status of husbands FMLA and she stated that they need to have it by 03/27/2015

## 2015-03-12 NOTE — ED Provider Notes (Signed)
CSN: [id number]     Arrival date & time 02/25/15  2222 History   First MD Initiated Contact with Patient 02/25/15 2328     Chief Complaint  Patient presents with  . Hemorrhoids     (Consider location/radiation/quality/duration/timing/severity/associated sxs/prior Treatment) HPI   28yM with painful hemorrhoids. HAs had them intermittently. Since yesterday very painful. No bleeding. Has tried preparation H with little relief. No fever or chills. No abdominal pain.   Past Medical History  Diagnosis Date  . Hemorrhoid    Past Surgical History  Procedure Laterality Date  . Knee surgery    . Anterior cruciate ligament repair  2005   Family History  Problem Relation Age of Onset  . Diabetes Mother   . Hypertension Mother    History  Substance Use Topics  . Smoking status: Never Smoker   . Smokeless tobacco: Never Used  . Alcohol Use: Yes     Comment: per month consumption    Review of Systems  All systems reviewed and negative, other than as noted in HPI.   Allergies  Review of patient's allergies indicates no known allergies.  Home Medications   Prior to Admission medications   Medication Sig Start Date End Date Taking? Authorizing Provider  hydrocortisone (ANUSOL-HC) 2.5 % rectal cream Place 1 application rectally 2 (two) times daily. 02/26/15   Salley ScarletKawanta F Acme, MD  lidocaine (XYLOCAINE) 2 % jelly Apply 1 application topically 4 (four) times daily as needed. Patient not taking: Reported on 02/26/2015 02/26/15   Raeford RazorStephen Elverna Caffee, MD  psyllium (METAMUCIL SMOOTH TEXTURE) 28 % packet Take 1 packet by mouth 2 (two) times daily. Patient not taking: Reported on 02/26/2015 02/26/15   Raeford RazorStephen Calogero Geisen, MD   BP 135/83 mmHg  Pulse 63  Temp(Src) 97.3 F (36.3 C) (Oral)  Resp 22  Ht 5\' 10"  (1.778 m)  Wt 237 lb 2 oz (107.559 kg)  BMI 34.02 kg/m2  SpO2 98% Physical Exam  Constitutional: He appears well-developed and well-nourished. No distress.  HENT:  Head: Normocephalic and  atraumatic.  Eyes: Conjunctivae are normal. Right eye exhibits no discharge. Left eye exhibits no discharge.  Neck: Neck supple.  Cardiovascular: Normal rate, regular rhythm and normal heart sounds.  Exam reveals no gallop and no friction rub.   No murmur heard. Pulmonary/Chest: Effort normal and breath sounds normal. No respiratory distress.  Abdominal: Soft. He exhibits no distension. There is no tenderness.  Genitourinary:  Prolapse internal hemorrhoid. Non thrombosed. Non bleeding. Able to reduce with gentle pressure.   Musculoskeletal: He exhibits no edema or tenderness.  Neurological: He is alert.  Skin: Skin is warm and dry.  Psychiatric: He has a normal mood and affect. His behavior is normal. Thought content normal.  Nursing note and vitals reviewed.   ED Course  Procedures (including critical care time) Labs Review Labs Reviewed - No data to display  Imaging Review No results found.   EKG Interpretation None      MDM   Final diagnoses:  Prolapsed internal hemorrhoids    Sitz baths, bulking agent, lidocaine jelly. General surgical FU.     Raeford RazorStephen Saran Laviolette, MD 03/12/15 [phone]

## 2015-03-12 NOTE — Telephone Encounter (Signed)
Called pt this morning and informed forms are ready and needs to pay the 20 dollars fee and will fax to pt's employer once receive pymt. Pt states will come up to office today to pay

## 2016-05-21 ENCOUNTER — Other Ambulatory Visit: Payer: Self-pay | Admitting: Occupational Medicine

## 2016-05-21 ENCOUNTER — Ambulatory Visit: Payer: Self-pay

## 2016-05-21 DIAGNOSIS — M79671 Pain in right foot: Secondary | ICD-10-CM

## 2016-11-22 ENCOUNTER — Encounter (HOSPITAL_COMMUNITY): Payer: Self-pay

## 2016-11-22 ENCOUNTER — Emergency Department (HOSPITAL_COMMUNITY)
Admission: EM | Admit: 2016-11-22 | Discharge: 2016-11-22 | Disposition: A | Discharge status: Home / Self Care | Payer: Commercial Managed Care - HMO | Attending: Emergency Medicine | Admitting: Emergency Medicine

## 2016-11-22 DIAGNOSIS — Y939 Activity, unspecified: Secondary | ICD-10-CM | POA: Insufficient documentation

## 2016-11-22 DIAGNOSIS — Z79899 Other long term (current) drug therapy: Secondary | ICD-10-CM | POA: Insufficient documentation

## 2016-11-22 DIAGNOSIS — Y999 Unspecified external cause status: Secondary | ICD-10-CM | POA: Diagnosis not present

## 2016-11-22 DIAGNOSIS — M25512 Pain in left shoulder: Secondary | ICD-10-CM | POA: Diagnosis present

## 2016-11-22 DIAGNOSIS — Y92410 Unspecified street and highway as the place of occurrence of the external cause: Secondary | ICD-10-CM | POA: Diagnosis not present

## 2016-11-22 NOTE — Discharge Instructions (Signed)
Please read attached information. If you experience any new or worsening signs or symptoms please return to the emergency room for evaluation. Please follow-up with your primary care provider or specialist as discussed. Please use medication prescribed only as directed and discontinue taking if you have any concerning signs or symptoms.   °
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
## 2017-01-14 ENCOUNTER — Telehealth: Payer: Self-pay | Admitting: Emergency Medicine

## 2017-01-14 NOTE — Telephone Encounter (Deleted)
Patient called and expressed that he needed to get a new script for his CPAP machine supplies.  I informed Darl Pikes and she authorized a year refill for his supplies.  I faxed the script to [facility]. Melissa from [facility] said once they received the script they will contact the patient to set up a delivery time to send his supplies.

## 2017-08-19 ENCOUNTER — Ambulatory Visit: Payer: 59 | Admitting: Physician Assistant

## 2017-08-19 ENCOUNTER — Encounter: Payer: Self-pay | Admitting: Physician Assistant

## 2017-08-19 ENCOUNTER — Other Ambulatory Visit: Payer: Self-pay

## 2017-08-19 VITALS — BP 122/88 | HR 89 | Temp 98.1°F | Resp 16 | Wt 246.8 lb

## 2017-08-19 DIAGNOSIS — F4310 Post-traumatic stress disorder, unspecified: Secondary | ICD-10-CM

## 2017-08-19 DIAGNOSIS — F4321 Adjustment disorder with depressed mood: Secondary | ICD-10-CM | POA: Diagnosis not present

## 2017-08-19 NOTE — Progress Notes (Signed)
Patient ID: Connor Castillo MRN: [id number], DOB: 10-29-1985, 31 y.o. Date of Encounter: 08/19/2017, 4:43 PM    Chief Complaint:  Chief Complaint  Patient presents with  . Dealing with the lost of co-worker     HPI: [age] y.o. year old male presents with above.   He works with [employer].  Reports that last week, on that Thursday, which would have been November 1, he and several coworkers climbed up the side of the water tower and then spent time on top of the [employer] "in the bowl "and then climbed back down using harnesses.  Reports that one of the coworkers, who was above him on the ladder when they were coming down, died. ( I don't know if there was a malfunction of the equipment or what).  Just tells me that he heard some clicking noises and the next thing he knew, there was a louder noise and then realized that she had fallen to the ground and died.  With him today, he has his cell phone.  There is a video recording of him at the water tower prior to the climb up.  With him today, he also has carbon copy of his report that he had to turn and to detectives, police. He reports that he had to answer questioning with detectives and police.  He reports that he keeps hearing the clanking and banging sounds that he heard that day-- over and over in his head. He reports that he keeps thinking back through the questioning from the police and the detectives. 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   He does not feel like he is in a good mental state to be able to completely focus on his job.   Concerned that this could affect the safety of himself and others as well as some doing quality work. He also is concerned about being at work around coworkers constantly asking him questions about what happened.  Reports  that he has talked with a counselor and that was covered through his job. He reports that he is currently on administrative leave through Monday as they are [facility] Monday [facility].   However he feels that he needs more time away from work to mentally and emotionally handle this.   Concerned about being in deep holes or in areas where he will hear that similar clanking and banging sound or in situations where he needs to be able to completely focus. He feels that he needs some time away from being around these coworkers reminders of the event that day.       Home Meds:   Outpatient Medications Prior to Visit  Medication Sig Dispense Refill  . hydrocortisone (ANUSOL-HC) 2.5 % rectal cream Place 1 application rectally 2 (two) times daily. 30 g 2  . lidocaine (XYLOCAINE) 2 % jelly Apply 1 application topically 4 (four) times daily as needed. (Patient not taking: Reported on 02/26/2015) 30 mL 0  . psyllium (METAMUCIL SMOOTH TEXTURE) 28 % packet Take 1 packet by mouth 2 (two) times daily. (Patient not taking: Reported on 02/26/2015) 30 packet 0   No facility-administered medications prior to visit.     Allergies: No Known Allergies    Review of Systems: See HPI for pertinent ROS. All other ROS negative.  Physical Exam: Blood pressure 122/88, pulse 89, temperature 98.1 F (36.7 C), temperature source Oral, resp. rate 16, weight 111.9 kg (246 lb 12.8 oz), SpO2 98 %., Body mass index is 35.41 kg/m. General:  WNWD AAM. Appears in no acute distress. Neck: Supple. No thyromegaly. No lymphadenopathy. Lungs: Clear bilaterally to auscultation without wheezes, rales, or rhonchi. Breathing is unlabored. Heart: Regular rhythm. No murmurs, rubs, or gallops. Msk:  Strength and tone normal for age. Extremities/Skin: Warm and dry. Neuro: Alert and oriented X 3. Moves all extremities spontaneously. Gait is normal. CNII-XII grossly in tact. Psych:  Responds to questions  appropriately with a normal affect.     ASSESSMENT AND PLAN:  31 y.o. year old male with  1. Grief reaction  2. PTSD (post-traumatic stress disorder)  Note given for him to be out of work through Monday, November 26.   He will follow-up with me at office visit Monday, November 26 to determine if he feels stable to return to work.  [address]igned, Mary Beth East Griffin[place], [place], [facility] 08/19/2017 4:43 PM

## 2017-09-03 IMAGING — CR DG FOOT COMPLETE 3+V*R*
3 series · 3 of 3 positions shown · non-contrast
Comparison: None.

CLINICAL DATA: Blunt trauma to the dorsum of the foot today with
pain

EXAM:
RIGHT FOOT COMPLETE - 3+ VIEW

[view not recorded (1 of 3)]
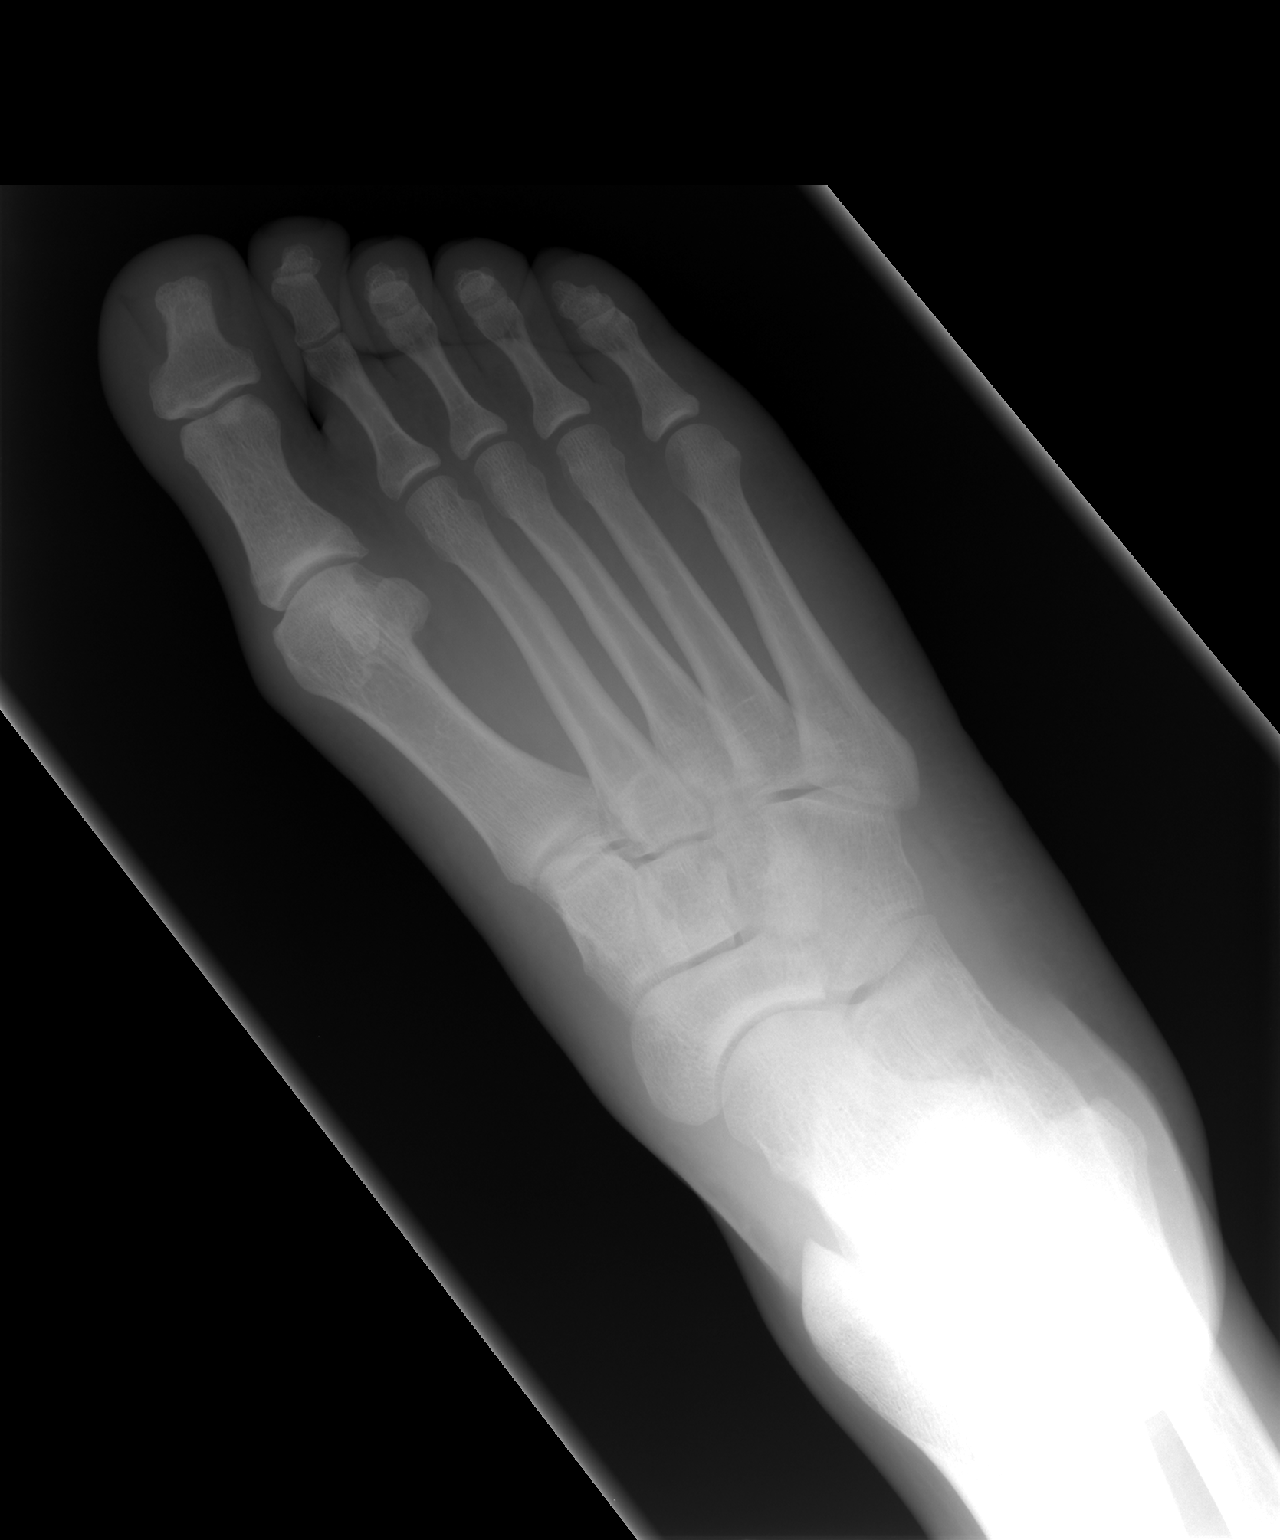

[view not recorded (2 of 3)]
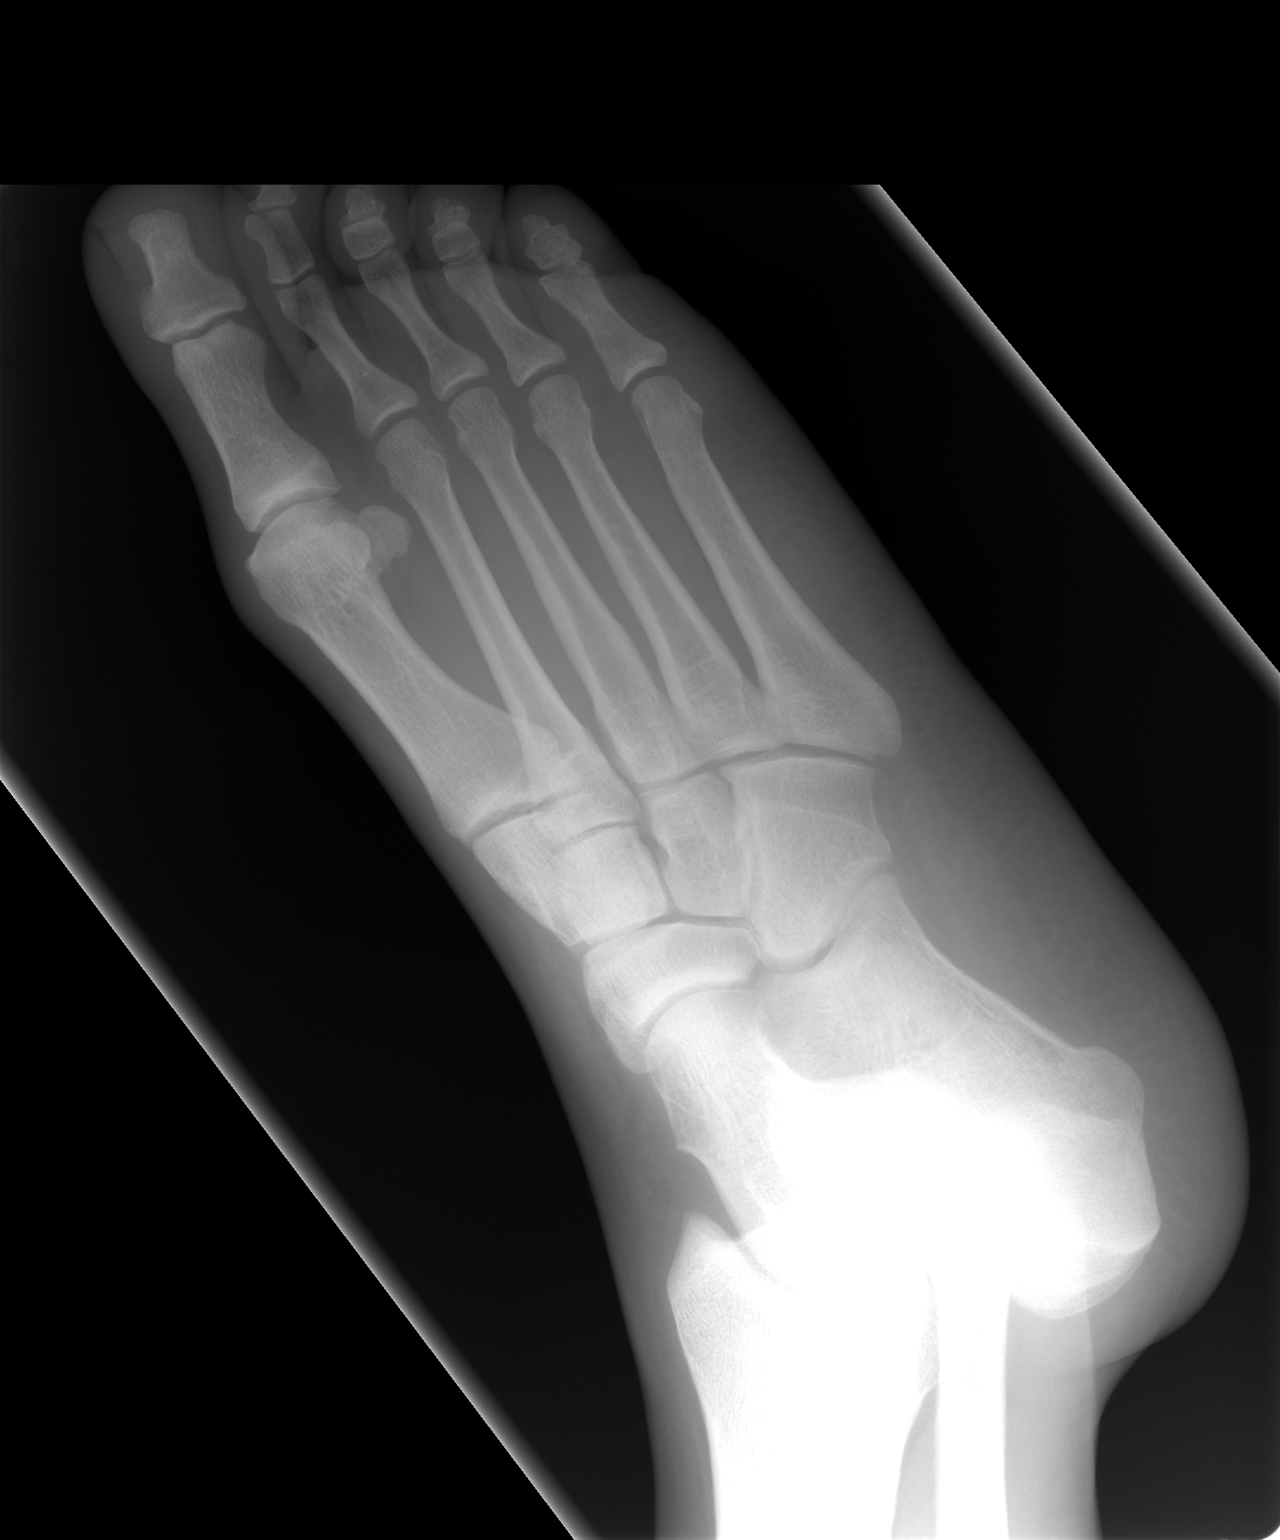

[view not recorded (3 of 3)]
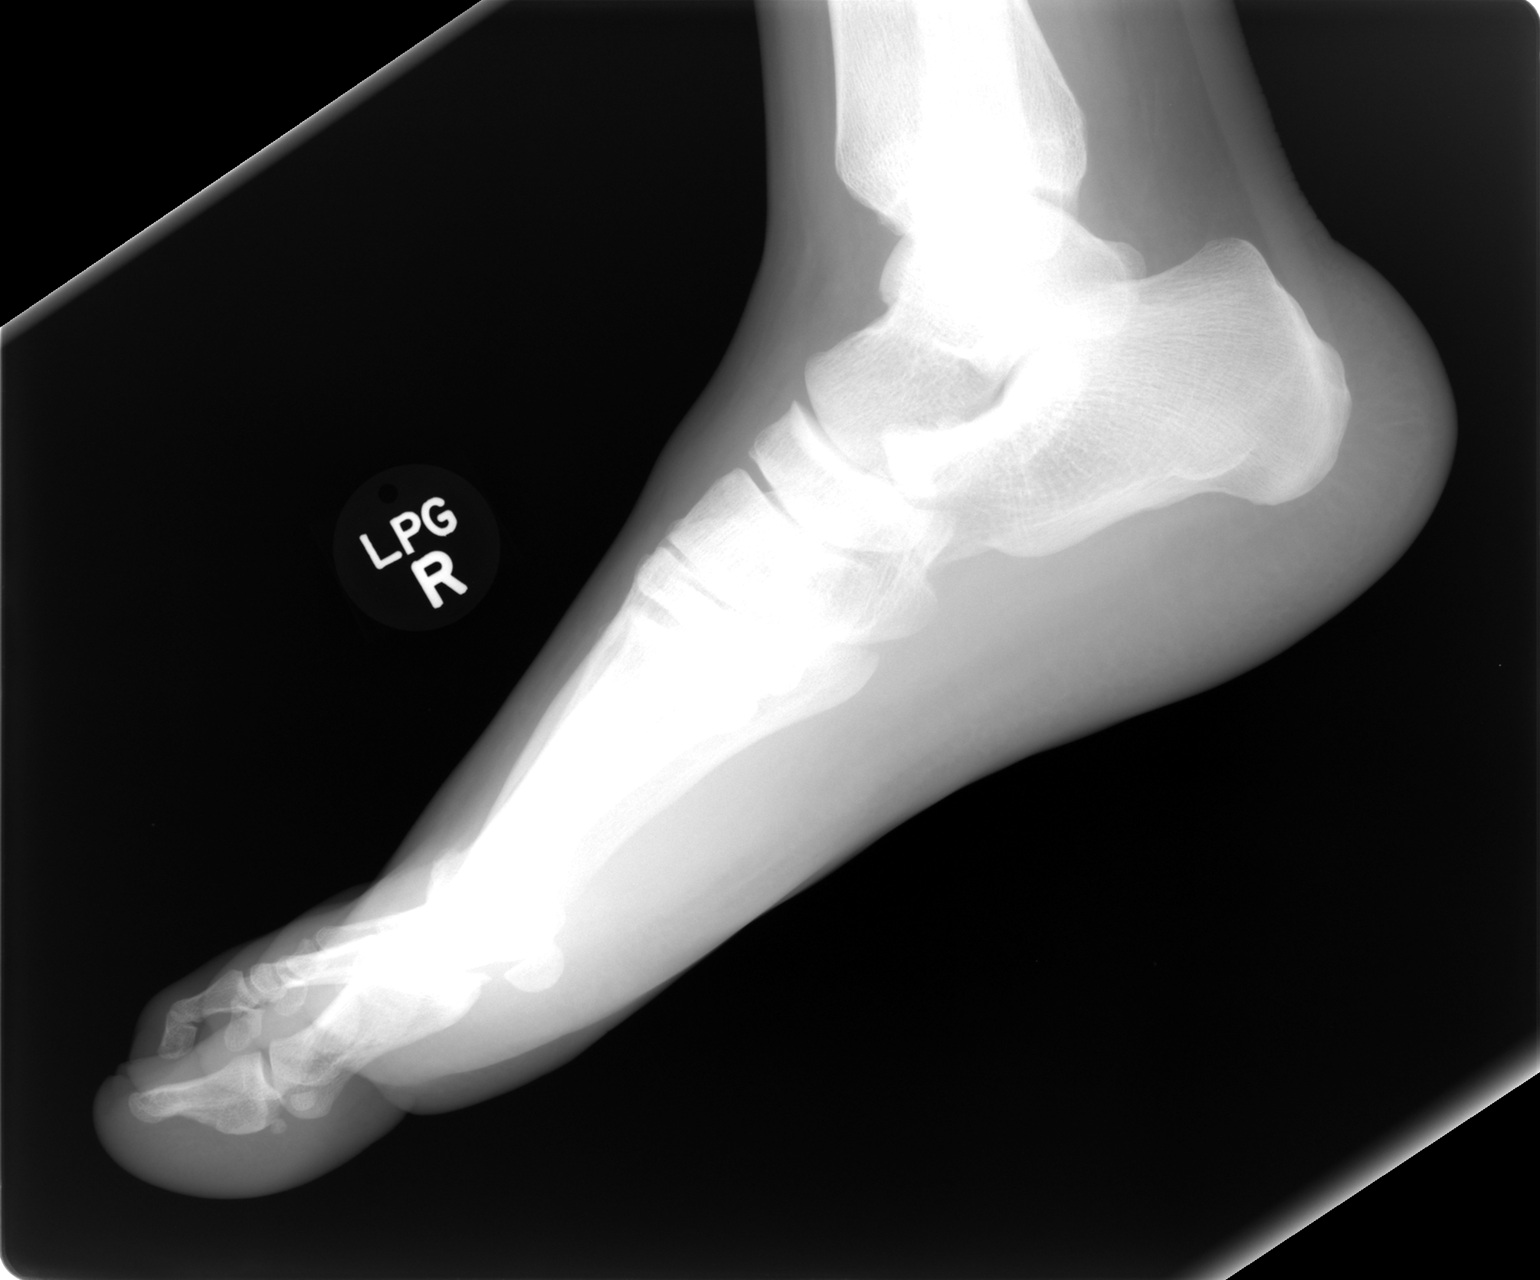

[3 of 3 positions shown; findings below may reference images not displayed]

FINDINGS: Tarsal-metatarsal alignment is normal. No fracture is seen. Joint
spaces appear normal.
IMPRESSION: Negative.

## 2017-09-06 ENCOUNTER — Encounter: Payer: Self-pay | Admitting: Physician Assistant

## 2017-09-06 ENCOUNTER — Other Ambulatory Visit: Payer: Self-pay

## 2017-09-06 ENCOUNTER — Ambulatory Visit: Payer: 59 | Admitting: Physician Assistant

## 2017-09-06 VITALS — BP 118/88 | HR 111 | Temp 98.5°F | Resp 16 | Ht 70.0 in | Wt 251.6 lb

## 2017-09-06 DIAGNOSIS — F4321 Adjustment disorder with depressed mood: Secondary | ICD-10-CM

## 2017-09-06 NOTE — Progress Notes (Signed)
Patient ID: Connor Castillo MRN: [id number], DOB: [birth year], 31 y.o. Date of Encounter: 09/06/2017, 2:31 PM    Chief Complaint:  Chief Complaint  Patient presents with  . routine follow up office visit     HPI: 31 y.o. year old male presents with above.   08/19/2017: He works with [employer]  Reports that last week, on that Thursday, which would have been November 1, he and several coworkers climbed up the side of the water tower and then spent time on top of the [employer] "in the bowl "and then climbed back down using harnesses.  Reports that one of the coworkers, who was above him on the ladder when they were coming down, died. ( I don't know if there was a malfunction of the equipment or what).  Just tells me that he heard some clicking noises and the next thing he knew, there was a louder noise and then realized that she had fallen to the ground and died.  With him today, he has his cell phone.  There is a video recording of him at the water tower prior to the climb up.  With him today, he also has carbon copy of his report that he had to turn and to detectives, police. He reports that he had to answer questioning with detectives and police.  He reports that he keeps hearing the clanking and banging sounds that he heard that day-- over and over in his head. He reports that he keeps thinking back through the questioning from the police and the detectives. 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   He does not feel like he is in a good mental state to be able to completely focus on his job.   Concerned that this could affect the safety of himself and others as well as some doing quality work. He also is concerned about being at work around coworkers constantly asking him questions about what  happened.  Reports that he has talked with a counselor and that was covered through his job. He reports that he is currently on administrative leave through Monday as they are already off Monday through for [facility].   However he feels that he needs more time away from work to mentally and emotionally handle this.   Concerned about being in deep holes or in areas where he will hear that similar clanking and banging sound or in situations where he needs to be able to completely focus. He feels that he needs some time away from being around these coworkers reminders of the event that day. A/P AT THAT OV: 1. Grief reaction 2. PTSD (post-traumatic stress disorder) Note given for him to be out of work through Monday, November 26.   He will follow-up with me at office visit Monday, November 26 to determine if he feels stable to return to work.   09/06/2017: He presents for follow-up visit today. He reports that he did go to [employer]. He reports that he has not met with the therapist anymore so far but thinks that he will be meeting with them again tomorrow. After his visit with me, he did meet with HR and is to meet with them again tomorrow morning at 7:30 AM. Also he states that he "did meet with [employer] to see about  getting some compensation other than just getting the pay that is given for Administrative leave".  Says that this has really affected him.  Says that every time he hears a little sound it affects him.  Says that he is finally starting to sleep a little bit.  Says that it has also affected his wife because she sees him like this.  Says that he "knows he will have to take it one day at the time" and that it is going to take a long time but thinks that he can try returning to work tomorrow.    Home Meds:   No outpatient medications prior to visit.   No facility-administered medications prior to visit.     Allergies: No Known Allergies    Review of Systems:  See HPI for pertinent ROS. All other ROS negative.    Physical Exam: Blood pressure 118/88, pulse (!) 111, temperature 98.5 F (36.9 C), temperature source Oral, resp. rate 16, height '5\' 10"'  (1.778 m), weight 114.1 kg (251 lb 9.6 oz), SpO2 98 %., Body mass index is 36.1 kg/m. General:  WNWD AAM. Appears in no acute distress. Neck: Supple. No thyromegaly. No lymphadenopathy. Lungs: Clear bilaterally to auscultation without wheezes, rales, or rhonchi. Breathing is unlabored. Heart: Regular rhythm. No murmurs, rubs, or gallops. Msk:  Strength and tone normal for age. Extremities/Skin: Warm and dry. Neuro: Alert and oriented X 3. Moves all extremities spontaneously. Gait is normal. CNII-XII grossly in tact. Psych:  Responds to questions appropriately with a normal affect.     ASSESSMENT AND PLAN:  31 y.o. year old male with   1. Grief reaction  2. PTSD (post-traumatic stress disorder)  Note given for him to return to work September 07, 2017.  F/U PRN  Signed, Olean Ree Rarden, [place], [facility] 09/06/2017 2:31 PM

## 2018-04-18 ENCOUNTER — Ambulatory Visit: Payer: Self-pay

## 2018-04-18 ENCOUNTER — Other Ambulatory Visit: Payer: Self-pay | Admitting: Occupational Medicine

## 2018-04-18 DIAGNOSIS — M25512 Pain in left shoulder: Secondary | ICD-10-CM

## 2018-05-11 DIAGNOSIS — M25512 Pain in left shoulder: Secondary | ICD-10-CM | POA: Insufficient documentation

## 2018-06-17 DIAGNOSIS — M7542 Impingement syndrome of left shoulder: Secondary | ICD-10-CM | POA: Insufficient documentation

## 2019-04-11 ENCOUNTER — Encounter: Payer: 59 | Admitting: Family Medicine

## 2019-04-25 ENCOUNTER — Encounter: Payer: Self-pay | Admitting: Family Medicine

## 2019-04-25 ENCOUNTER — Ambulatory Visit (INDEPENDENT_AMBULATORY_CARE_PROVIDER_SITE_OTHER): Payer: 59 | Admitting: Family Medicine

## 2019-04-25 ENCOUNTER — Other Ambulatory Visit: Payer: Self-pay

## 2019-04-25 VITALS — BP 132/80 | HR 69 | Temp 98.6°F | Resp 18 | Ht 70.0 in | Wt 244.2 lb

## 2019-04-25 DIAGNOSIS — Z0001 Encounter for general adult medical examination with abnormal findings: Secondary | ICD-10-CM

## 2019-04-25 DIAGNOSIS — Z13220 Encounter for screening for lipoid disorders: Secondary | ICD-10-CM | POA: Diagnosis not present

## 2019-04-25 DIAGNOSIS — Z130 Encounter for screening for diseases of the blood and blood-forming organs and certain disorders involving the immune mechanism: Secondary | ICD-10-CM

## 2019-04-25 DIAGNOSIS — E669 Obesity, unspecified: Secondary | ICD-10-CM

## 2019-04-25 DIAGNOSIS — Z13228 Encounter for screening for other metabolic disorders: Secondary | ICD-10-CM

## 2019-04-25 DIAGNOSIS — Z1329 Encounter for screening for other suspected endocrine disorder: Secondary | ICD-10-CM | POA: Diagnosis not present

## 2019-04-25 DIAGNOSIS — Z6835 Body mass index (BMI) 35.0-35.9, adult: Secondary | ICD-10-CM

## 2019-04-25 DIAGNOSIS — Z114 Encounter for screening for human immunodeficiency virus [HIV]: Secondary | ICD-10-CM

## 2019-04-25 NOTE — Patient Instructions (Signed)
 Health Maintenance, Male Adopting a healthy lifestyle and getting preventive care are important in promoting health and wellness. Ask your health care provider about:  The right schedule for you to have regular tests and exams.  Things you can do on your own to prevent diseases and keep yourself healthy. What should I know about diet, weight, and exercise? Eat a healthy diet   Eat a diet that includes plenty of vegetables, fruits, low-fat dairy products, and lean protein.  Do not eat a lot of foods that are high in solid fats, added sugars, or sodium. Maintain a healthy weight Body mass index (BMI) is a measurement that can be used to identify possible weight problems. It estimates body fat based on height and weight. Your health care provider can help determine your BMI and help you achieve or maintain a healthy weight. Get regular exercise Get regular exercise. This is one of the most important things you can do for your health. Most adults should:  Exercise for at least 150 minutes each week. The exercise should increase your heart rate and make you sweat (moderate-intensity exercise).  Do strengthening exercises at least twice a week. This is in addition to the moderate-intensity exercise.  Spend less time sitting. Even light physical activity can be beneficial. Watch cholesterol and blood lipids Have your blood tested for lipids and cholesterol at 35 years of age, then have this test every 5 years. You may need to have your cholesterol levels checked more often if:  Your lipid or cholesterol levels are high.  You are older than 35 years of age.  You are at high risk for heart disease. What should I know about cancer screening? Many types of cancers can be detected early and may often be prevented. Depending on your health history and family history, you may need to have cancer screening at various ages. This may include screening for:  Colorectal cancer.  Prostate  cancer.  Skin cancer.  Lung cancer. What should I know about heart disease, diabetes, and high blood pressure? Blood pressure and heart disease  High blood pressure causes heart disease and increases the risk of stroke. This is more likely to develop in people who have high blood pressure readings, are of African descent, or are overweight.  Talk with your health care provider about your target blood pressure readings.  Have your blood pressure checked: ? Every 3-5 years if you are 18-39 years of age. ? Every year if you are 40 years old or older.  If you are between the ages of 65 and 75 and are a current or former smoker, ask your health care provider if you should have a one-time screening for abdominal aortic aneurysm (AAA). Diabetes Have regular diabetes screenings. This checks your fasting blood sugar level. Have the screening done:  Once every three years after age 45 if you are at a normal weight and have a low risk for diabetes.  More often and at a younger age if you are overweight or have a high risk for diabetes. What should I know about preventing infection? Hepatitis B If you have a higher risk for hepatitis B, you should be screened for this virus. Talk with your health care provider to find out if you are at risk for hepatitis B infection. Hepatitis C Blood testing is recommended for:  Everyone born from 1945 through 1965.  Anyone with known risk factors for hepatitis C. Sexually transmitted infections (STIs)  You should be screened each   year for STIs, including gonorrhea and chlamydia, if: ? You are sexually active and are younger than 35 years of age. ? You are older than 35 years of age and your health care provider tells you that you are at risk for this type of infection. ? Your sexual activity has changed since you were last screened, and you are at increased risk for chlamydia or gonorrhea. Ask your health care provider if you are at risk.  Ask your  health care provider about whether you are at high risk for HIV. Your health care provider may recommend a prescription medicine to help prevent HIV infection. If you choose to take medicine to prevent HIV, you should first get tested for HIV. You should then be tested every 3 months for as long as you are taking the medicine. Follow these instructions at home: Lifestyle  Do not use any products that contain nicotine or tobacco, such as cigarettes, e-cigarettes, and chewing tobacco. If you need help quitting, ask your health care provider.  Do not use street drugs.  Do not share needles.  Ask your health care provider for help if you need support or information about quitting drugs. Alcohol use  Do not drink alcohol if your health care provider tells you not to drink.  If you drink alcohol: ? Limit how much you have to 0-2 drinks a day. ? Be aware of how much alcohol is in your drink. In the [place] one drink equals one 12 oz bottle of beer (355 mL), one 5 oz glass of wine (148 mL), or one 1 oz glass of hard liquor (44 mL). General instructions  Schedule regular health, dental, and eye exams.  Stay current with your vaccines.  Tell your health care provider if: ? You often feel depressed. ? You have ever been abused or do not feel safe at home. Summary  Adopting a healthy lifestyle and getting preventive care are important in promoting health and wellness.  Follow your health care provider's instructions about healthy diet, exercising, and getting tested or screened for diseases.  Follow your health care provider's instructions on monitoring your cholesterol and blood pressure. This information is not intended to replace advice given to you by your health care provider. Make sure you discuss any questions you have with your health care provider. Document Released: 03/26/2008 Document Revised: 09/21/2018 Document Reviewed: 09/21/2018 Elsevier Patient Education  2020 [employer]   American Heart Association (AHA) Exercise Recommendation  Being physically active is important to prevent heart disease and stroke, the nation's No. 1and No. 5killers. To improve overall cardiovascular health, we suggest at least 150 minutes per week of moderate exercise or 75 minutes per week of vigorous exercise (or a combination of moderate and vigorous activity). Thirty minutes a day, five times a week is an easy goal to remember. You will also experience benefits even if you divide your time into two or three segments of 10 to 15 minutes per day.  For people who would benefit from lowering their blood pressure or cholesterol, we recommend 40 minutes of aerobic exercise of moderate to vigorous intensity three to four times a week to lower the risk for heart attack and stroke.  Physical activity is anything that makes you move your body and burn calories.  This includes things like climbing stairs or playing sports. Aerobic exercises benefit your heart, and include walking, jogging, swimming or biking. Strength and stretching exercises are best for overall stamina and flexibility.  The simplest, positive  change you can make to effectively improve your heart health is to start walking. It's enjoyable, free, easy, social and great exercise. A walking program is flexible and boasts high success rates because people can stick with it. It's easy for walking to become a regular and satisfying part of life.   For Overall Cardiovascular Health:  At least 30 minutes of moderate-intensity aerobic activity at least 5 days per week for a total of 150  OR   At least 25 minutes of vigorous aerobic activity at least 3 days per week for a total of 75 minutes; or a combination of moderate- and vigorous-intensity aerobic activity  AND   Moderate- to high-intensity muscle-strengthening activity at least 2 days per week for additional health benefits.  For Lowering Blood Pressure and Cholesterol  An  average 40 minutes of moderate- to vigorous-intensity aerobic activity 3 or 4 times per week  What if I can't make it to the time goal? Something is always better than nothing! And everyone has to start somewhere. Even if you've been sedentary for years, today is the day you can begin to make healthy changes in your life. If you don't think you'll make it for 30 or 40 minutes, set a reachable goal for today. You can work up toward your overall goal by increasing your time as you get stronger. Don't let all-or-nothing thinking rob you of doing what you can every day.  Source:http://www.heart.org

## 2019-04-25 NOTE — Progress Notes (Signed)
Patient: Connor Castillo, Male    DOB: [birth year], 33 y.o.   MRN: [id number] Visit Date: 04/25/2019  Today's Provider: Delsa Grana, PA-C   Chief Complaint  Patient presents with  . Annual Exam   Subjective:    Annual physical exam Connor Castillo is a 33 y.o. male who presents today for health maintenance and complete physical. He feels well. He reports not exercising. He reports he is sleeping well.  -----------------------------------------------------------------   Left shoulder - went to UC/ortho has xrays and shot in shoulder, got better.  Has gets some intermittent neck and back pain  Dad stomach cancer 2019 august  No other concerns   Review of Systems  Constitutional: Negative.   HENT: Negative.   Eyes: Negative.   Respiratory: Negative.   Cardiovascular: Negative.   Gastrointestinal: Negative.   Endocrine: Negative.   Genitourinary: Negative.   Musculoskeletal: Negative.   Skin: Negative.   Allergic/Immunologic: Negative.   Neurological: Negative.   Hematological: Negative.   Psychiatric/Behavioral: Negative.   All other systems reviewed and are negative.   Social History      He  reports that he has never smoked. He has never used smokeless tobacco. He reports current alcohol use. He reports that he does not use drugs.       Social History   Socioeconomic History  . Marital status: Married    Spouse name: Not on file  . Number of children: Not on file  . Years of education: Not on file  . Highest education level: Not on file  Occupational History  . Not on file  Social Needs  . Financial resource strain: Not on file  . Food insecurity    Worry: Not on file    Inability: Not on file  . Transportation needs    Medical: Not on file    Non-medical: Not on file  Tobacco Use  . Smoking status: Never Smoker  . Smokeless tobacco: Never Used  Substance and Sexual Activity  . Alcohol use: Yes    Comment: per month consumption  .  Drug use: No  . Sexual activity: Not on file  Lifestyle  . Physical activity    Days per week: Not on file    Minutes per session: Not on file  . Stress: Not on file  Relationships  . Social Herbalist on phone: Not on file    Gets together: Not on file    Attends religious service: Not on file    Active member of club or organization: Not on file    Attends meetings of clubs or organizations: Not on file    Relationship status: Not on file  Other Topics Concern  . Not on file  Social History Narrative   Entered 01/23/2015:      Married. Lives with his wife.   Has no children.   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   Is off Friday Saturday Sunday.    Past Medical History:  Diagnosis Date  . Hemorrhoid      There are no active problems to display for this patient.   Past Surgical History:  Procedure Laterality Date  . ANTERIOR CRUCIATE LIGAMENT REPAIR  2005  . KNEE SURGERY      Family History        Family Status  Relation Name Status  .  Mother  Alive  . Father  Alive  . Sister  Alive  . Brother  Alive  . MGM  Alive  . MGF  Alive  . PGM  Alive  . PGF  Deceased at age 34  . Brother  Alive        His family history includes Diabetes in his mother; Hypertension in his mother.      No Known Allergies  No current outpatient medications on file.   Patient Care Team: Delsa Grana, PA-C as PCP - General (Family Medicine)      Objective:   Vitals: BP 132/80   Pulse 69   Temp 98.6 F (37 C)   Resp 18   Ht '5\' 10"'  (1.778 m)   Wt 244 lb 3.2 oz (110.8 kg)   SpO2 99%   BMI 35.04 kg/m    Vitals:   04/25/19 0926  BP: 132/80  Pulse: 69  Resp: 18  Temp: 98.6 F (37 C)  SpO2: 99%  Weight: 244 lb 3.2 oz (110.8 kg)  Height: '5\' 10"'  (1.778 m)     Physical Exam Constitutional:      General: He is not in acute distress.     Appearance: Normal appearance. He is well-developed. He is not ill-appearing or toxic-appearing.  HENT:     Head: Normocephalic and atraumatic.     Jaw: No trismus.     Right Ear: Tympanic membrane, ear canal and external ear normal.     Left Ear: Tympanic membrane, ear canal and external ear normal.     Nose: No mucosal edema or rhinorrhea.     Right Sinus: No maxillary sinus tenderness or frontal sinus tenderness.     Left Sinus: No maxillary sinus tenderness or frontal sinus tenderness.     Mouth/Throat:     Pharynx: Uvula midline. No oropharyngeal exudate, posterior oropharyngeal erythema or uvula swelling.  Eyes:     General: Lids are normal.     Conjunctiva/sclera: Conjunctivae normal.     Pupils: Pupils are equal, round, and reactive to light.  Neck:     Musculoskeletal: Normal range of motion and neck supple.     Trachea: Trachea and phonation normal. No tracheal deviation.  Cardiovascular:     Rate and Rhythm: Regular rhythm.     Pulses: Normal pulses.          Radial pulses are 2+ on the right side and 2+ on the left side.       Posterior tibial pulses are 2+ on the right side and 2+ on the left side.     Heart sounds: Normal heart sounds. No murmur. No friction rub. No gallop.   Pulmonary:     Effort: Pulmonary effort is normal.     Breath sounds: Normal breath sounds. No wheezing, rhonchi or rales.  Abdominal:     General: Bowel sounds are normal. There is no distension.     Palpations: Abdomen is soft.     Tenderness: There is no abdominal tenderness. There is no guarding or rebound.  Musculoskeletal: Normal range of motion.  Skin:    General: Skin is warm and dry.     Capillary Refill: Capillary refill takes less than 2 seconds.     Findings: No rash.  Neurological:     Mental Status: He is alert and oriented to person, place, and time.     Gait: Gait normal.  Psychiatric:        Speech: Speech normal.  Behavior: Behavior normal.      Depression  Screen PHQ 2/9 Scores 04/25/2019 09/06/2017 08/19/2017 01/23/2015  PHQ - 2 Score 0 3 0 0  PHQ- 9 Score - 11 - -     Office Visit from 04/25/2019 in [facility]  AUDIT-C Score  0         Assessment & Plan:     Routine Health Maintenance and Physical Exam  Exercise Activities and Dietary recommendations Goals - healthy diet and exercise 3-5 x a week - handout given    Discussed health benefits of physical activity, and encouraged him to engage in regular exercise appropriate for his age and condition.   Immunization History  Administered Date(s) Administered  . DTaP 01/02/1985, 05/17/1985, 01/10/1986, 01/29/1987  . Hepatitis B 07/24/1997, 08/28/1997, 01/28/1998  . HiB (PRP-OMP) 01/29/1987  . MMR 01/10/1986  . OPV 01/02/1985, 05/17/1985, 01/10/1986, 01/29/1987  . Tdap 11/08/2012    Health Maintenance  Topic Date Due  . HIV Screening  10/05/1999  . INFLUENZA VACCINE  05/13/2019  . TETANUS/TDAP  11/08/2022      ICD-10-CM   1. Encounter for general adult medical examination with abnormal findings  Z00.01 Hemoglobin A1c    CBC with Differential/Platelet    COMPLETE METABOLIC PANEL WITH GFR    Lipid panel    HIV Antibody (routine testing w rflx)   CPE done, BMI elevated, screening labs ordered, screening PHQ and Audit done and negative.  Handout given for exercise, diet and health recommendations  2. Screening for deficiency anemia  Z13.0 CBC with Differential/Platelet  3. Screening for lipoid disorders  Z13.220 Lipid panel  4. Screening for endocrine, metabolic and immunity disorder  Z13.29 Hemoglobin A1c   Z13.228 CBC with Differential/Platelet   G81.8 COMPLETE METABOLIC PANEL WITH GFR    Lipid panel  5. Screening for HIV (human immunodeficiency virus)  Z11.4 HIV Antibody (routine testing w rflx)  6. Class 2 obesity with body mass index (BMI) of 35.0 to 35.9 in adult, unspecified obesity type, unspecified whether serious comorbidity present  E66.9  Hemoglobin A1c   Z68.35 Lipid panel          Delsa Grana, PA-C 04/25/19 9:40 AM  [facility]

## 2019-04-26 ENCOUNTER — Encounter: Payer: Self-pay | Admitting: Family Medicine

## 2019-04-26 DIAGNOSIS — E785 Hyperlipidemia, unspecified: Secondary | ICD-10-CM | POA: Insufficient documentation

## 2019-04-26 LAB — CBC WITH DIFFERENTIAL/PLATELET
Absolute Monocytes: 403 cells/uL (ref 200–950)
Basophils Absolute: 29 cells/uL (ref 0–200)
Basophils Relative: 0.6 %
Eosinophils Absolute: 130 cells/uL (ref 15–500)
Eosinophils Relative: 2.7 %
HCT: 45.5 % (ref 38.5–50.0)
Hemoglobin: 15.7 g/dL (ref 13.2–17.1)
Lymphs Abs: 2390 cells/uL (ref 850–3900)
MCH: 29.7 pg (ref 27.0–33.0)
MCHC: 34.5 g/dL (ref 32.0–36.0)
MCV: 86.2 fL (ref 80.0–100.0)
MPV: 9.8 fL (ref 7.5–12.5)
Monocytes Relative: 8.4 %
Neutro Abs: 1848 cells/uL (ref 1500–7800)
Neutrophils Relative %: 38.5 %
Platelets: 260 10*3/uL (ref 140–400)
RBC: 5.28 10*6/uL (ref 4.20–5.80)
RDW: 12.1 % (ref 11.0–15.0)
Total Lymphocyte: 49.8 %
WBC: 4.8 10*3/uL (ref 3.8–10.8)

## 2019-04-26 LAB — HEMOGLOBIN A1C
Hgb A1c MFr Bld: 5.6 % of total Hgb (ref <5.7)
Mean Plasma Glucose: 114 (calc)
eAG (mmol/L): 6.3 (calc)

## 2019-04-26 LAB — COMPLETE METABOLIC PANEL WITH GFR
AG Ratio: 1.6 (calc) (ref 1.0–2.5)
ALT: 23 U/L (ref 9–46)
AST: 15 U/L (ref 10–40)
Albumin: 4.5 g/dL (ref 3.6–5.1)
Alkaline phosphatase (APISO): 54 U/L (ref 36–130)
BUN: 16 mg/dL (ref 7–25)
CO2: 29 mmol/L (ref 20–32)
Calcium: 9.7 mg/dL (ref 8.6–10.3)
Chloride: 102 mmol/L (ref 98–110)
Creat: 1.28 mg/dL (ref 0.60–1.35)
GFR, Est African American: 84 mL/min/{1.73_m2} (ref >=60)
GFR, Est Non African American: 73 mL/min/{1.73_m2} (ref >=60)
Globulin: 2.9 g/dL (calc) (ref 1.9–3.7)
Glucose, Bld: 92 mg/dL (ref 65–99)
Potassium: 4.6 mmol/L (ref 3.5–5.3)
Sodium: 139 mmol/L (ref 135–146)
Total Bilirubin: 0.6 mg/dL (ref 0.2–1.2)
Total Protein: 7.4 g/dL (ref 6.1–8.1)

## 2019-04-26 LAB — LIPID PANEL
Cholesterol: 202 mg/dL — ABNORMAL HIGH (ref <200)
HDL: 45 mg/dL (ref >=40)
LDL Cholesterol (Calc): 140 mg/dL (calc) — ABNORMAL HIGH
Non-HDL Cholesterol (Calc): 157 mg/dL (calc) — ABNORMAL HIGH (ref <130)
Total CHOL/HDL Ratio: 4.5 (calc) (ref <5.0)
Triglycerides: 76 mg/dL (ref <150)

## 2019-04-26 LAB — HIV ANTIBODY (ROUTINE TESTING W REFLEX): HIV 1&2 Ab, 4th Generation: NONREACTIVE

## 2019-08-01 IMAGING — DX DG SHOULDER 2+V*L*
3 series · 3 of 3 positions shown · non-contrast
Comparison: None in PACs

CLINICAL DATA: Lifting injury of the left shoulder today.

EXAM:
LEFT SHOULDER - 2+ VIEW

[shoulder ap (1 of 2)]
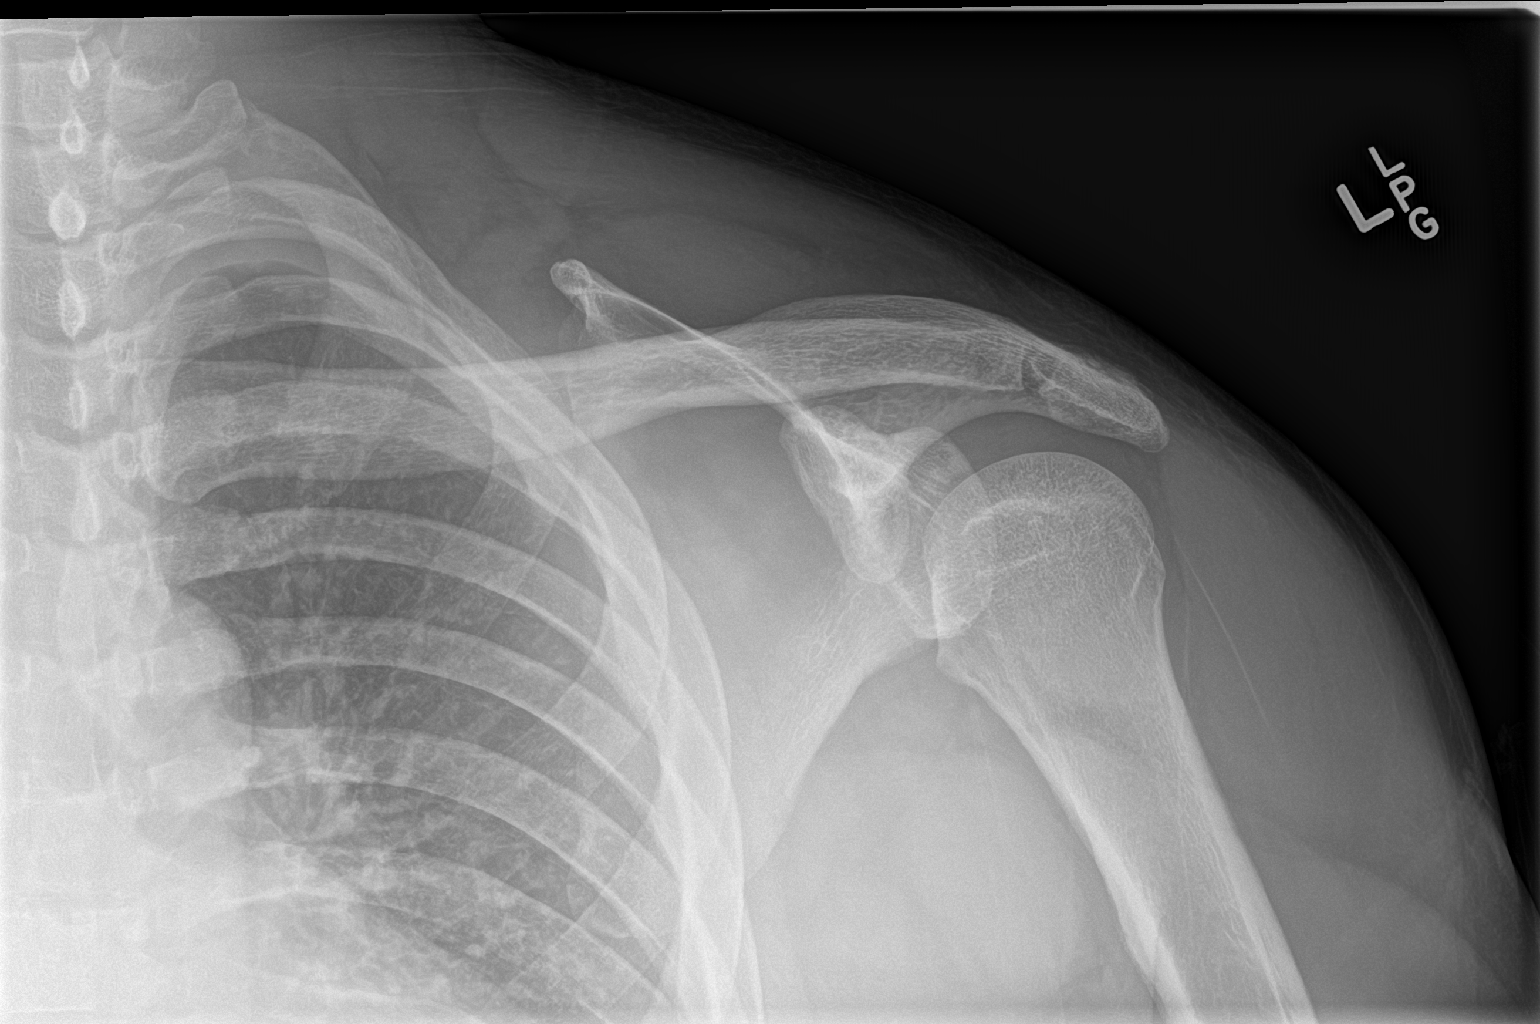

[shoulder ap (2 of 2)]
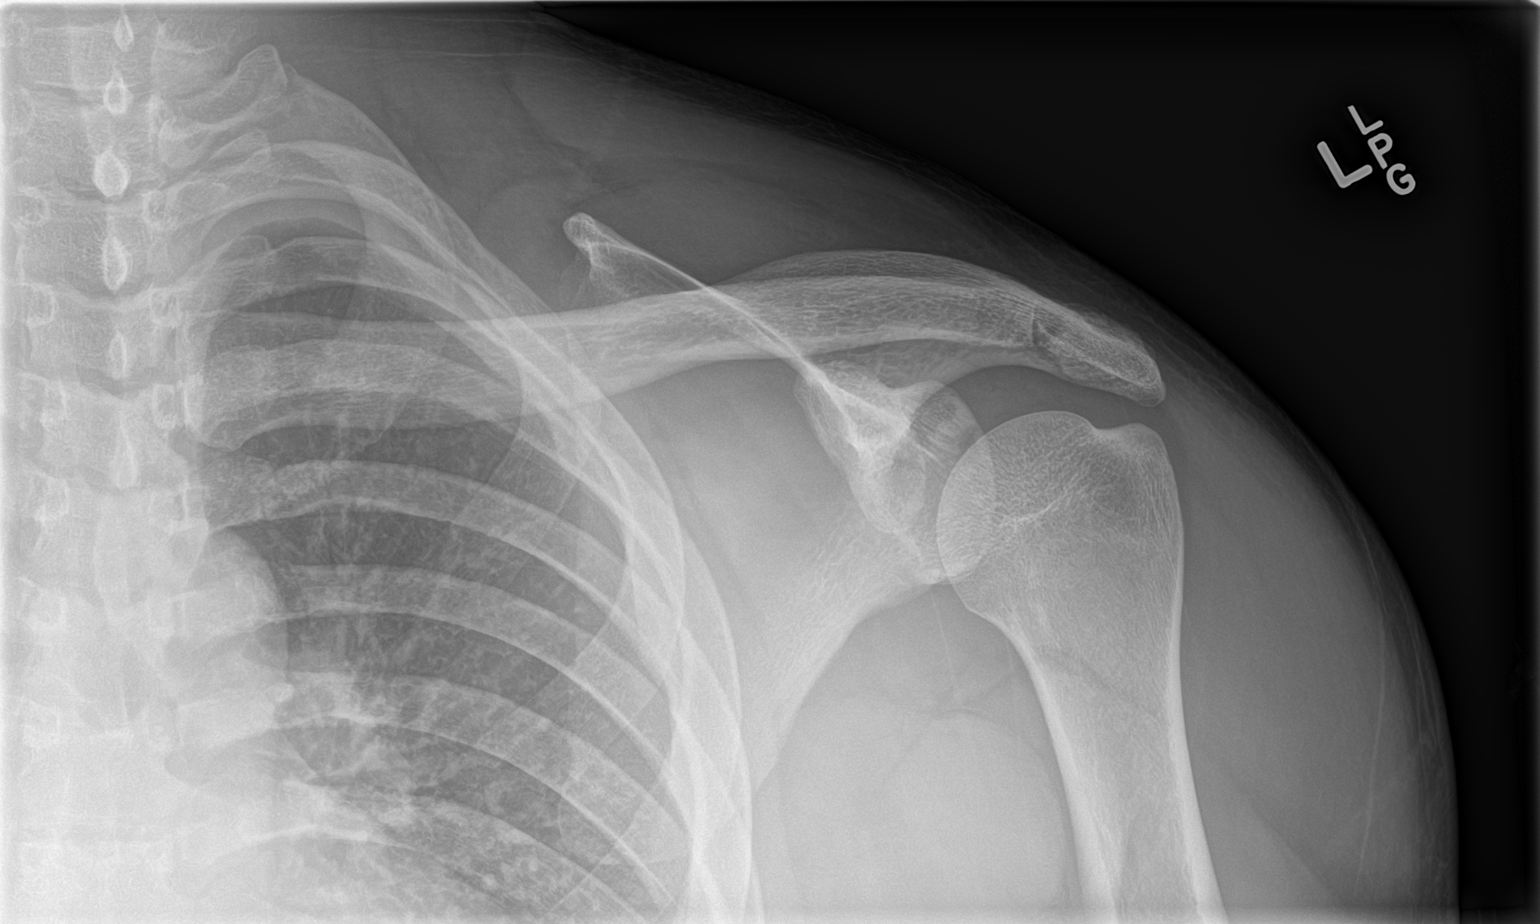

[shoulder y-view]
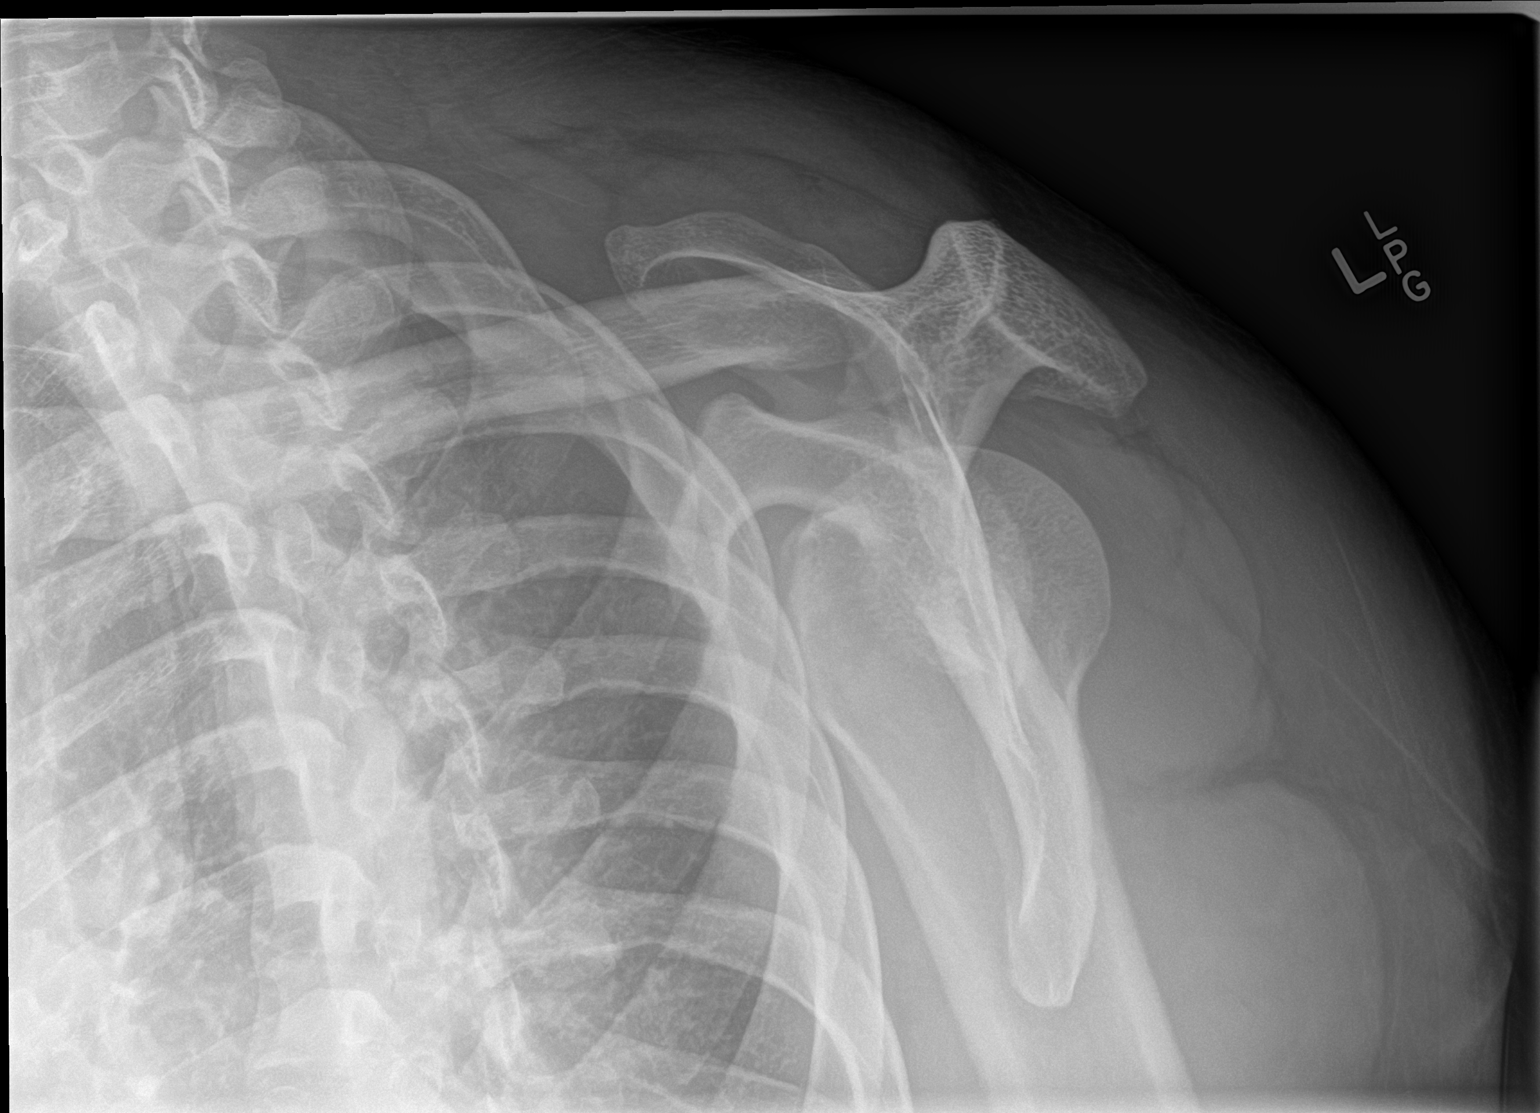

[3 of 3 positions shown; findings below may reference images not displayed]

FINDINGS: The bones are subjectively adequately mineralized. There is no acute
or healing fracture. There is a downsloping acromion with mild
narrowing of the subacromial subdeltoid space. The glenohumeral
joint and acromioclavicular joint spaces are well maintained.
IMPRESSION: There is no acute bony abnormality of the left shoulder nor evidence
of significant degenerative change. The downsloping acromion may
contribute to an impingement syndrome.

## 2020-02-26 ENCOUNTER — Ambulatory Visit: Payer: Self-pay | Admitting: General Surgery

## 2020-02-26 NOTE — H&P (View-Only) (Signed)
The patient is a 34 year old male who presents with hemorrhoids. Patient presents to the office with prolapsing internal hemorrhoids. I last saw him in August 2016. At that point he had some grade 2 hemorrhoids, but his symptoms were controlled well with fiber supplementation and occasional suppositories. Since that time he has had more trouble with prolapsing hemorrhoids. He reports occasional bleeding. He is using MiraLAX and milk of magnesia to help keep his bowel movements soft. He denies any frequent constipation or straining.   Past Surgical History (Tanisha A. Owens Shark, [place]; 02/26/2020 10:48 AM) No pertinent past surgical history  Diagnostic Studies History (Tanisha A. Owens Shark, [place]; 02/26/2020 10:48 AM) Colonoscopy never  Allergies (Tanisha A. Owens Shark, [place]; 02/26/2020 10:49 AM) No Known Drug Allergies [03/13/2015]: Allergies Reconciled  Medication History (Tanisha A. Owens Shark, [place]; 02/26/2020 10:49 AM) No Current Medications Medications Reconciled  Social History (Tanisha A. Owens Shark, [place]; 02/26/2020 10:48 AM) Alcohol use Moderate alcohol use, Occasional alcohol use. No drug use Tobacco use Never smoker.  Family History (Tanisha A. Owens Shark, [place]; 02/26/2020 10:48 AM) Family history unknown First Degree Relatives     Review of Systems (Tanisha A. Brown RMA; 02/26/2020 10:48 AM) General Not Present- Appetite Loss, Chills, Fatigue, Fever, Night Sweats, Weight Gain and Weight Loss. Skin Not Present- Change in Wart/Mole, Dryness, Hives, Jaundice, New Lesions, Non-Healing Wounds, Rash and Ulcer. HEENT Present- Earache and Ringing in the Ears. Not Present- Hearing Loss, Hoarseness, Nose Bleed, Oral Ulcers, Seasonal Allergies, Sinus Pain, Sore Throat, Visual Disturbances, Wears glasses/contact lenses and Yellow Eyes. Respiratory Not Present- Bloody sputum, Chronic Cough, Difficulty Breathing, Snoring and Wheezing. Cardiovascular Not Present- Chest Pain, Difficulty Breathing Lying Down,  Leg Cramps, Palpitations, Rapid Heart Rate, Shortness of Breath and Swelling of Extremities. Gastrointestinal Present- Bloody Stool, Constipation, Hemorrhoids and Rectal Pain. Not Present- Abdominal Pain, Bloating, Change in Bowel Habits, Chronic diarrhea, Difficulty Swallowing, Excessive gas, Gets full quickly at meals, Indigestion, Nausea and Vomiting. Male Genitourinary Not Present- Blood in Urine, Change in Urinary Stream, Frequency, Impotence, Nocturia, Painful Urination, Urgency and Urine Leakage. Musculoskeletal Present- Back Pain. Not Present- Joint Pain, Joint Stiffness, Muscle Pain, Muscle Weakness and Swelling of Extremities. Neurological Not Present- Decreased Memory, Fainting, Headaches, Numbness, Seizures, Tingling, Tremor, Trouble walking and Weakness. Psychiatric Not Present- Anxiety, Bipolar, Change in Sleep Pattern, Depression, Fearful and Frequent crying. Endocrine Not Present- Cold Intolerance, Excessive Hunger, Hair Changes, Heat Intolerance, Hot flashes and New Diabetes.  Vitals (Tanisha A. Brown RMA; 02/26/2020 10:49 AM) 02/26/2020 10:49 AM Weight: 249.6 lb Height: 70in Body Surface Area: 2.29 m Body Mass Index: 35.81 kg/m  Temp.: 98.45F  Pulse: 86 (Regular)  BP: 126/84(Sitting, Left Arm, Standard)        Physical Exam Leighton Ruff MD; 2/83/1517 11:09 AM)  General Mental Status-Alert. General Appearance-Consistent with stated age. Hydration-Well hydrated. Voice-Normal.  Rectal Anorectal Exam External - Note: Small skin tags noted. Internal - normal sphincter tone. Note: No masses palpated. Proctoscopic exam-Internal hemorrhoids(Inflamed, grade 2 left lateral hemorrhoid). Note: unable to perform proctoscopic exam due to pain  Neurologic Neurologic evaluation reveals -alert and oriented x 3 with no impairment of recent or remote memory. Mental Status-Normal.    Assessment & Plan Leighton Ruff MD; 03/27/736 11:08  AM)  PROLAPSED INTERNAL HEMORRHOIDS, GRADE 2 (K64.1) Impression: 34 year old male who I last saw approximately 5 years ago for anal pain. He was noted to have grade 2 hemorrhoids and given instructions on managing these medically. He has continued this but over the past few months he has  noticed worsening prolapse. On exam today, he appears to have at least one grade 2, large internal hemorrhoid. Exam was difficult to do due to a long anal canal. I recommended an anal exam under anesthesia with transient hemorrhoidal tear internalization. We did discuss hemorrhoidectomy and rubber band ligation as other alternatives. I do not think that these would be good choices in him due to his body habitus and minimal external symptoms.  Current Plans

## 2020-02-26 NOTE — H&P (Signed)
The patient is a 33 year old male who presents with hemorrhoids. Patient presents to the office with prolapsing internal hemorrhoids. I last saw him in August 2016. At that point he had some grade 2 hemorrhoids, but his symptoms were controlled well with fiber supplementation and occasional suppositories. Since that time he has had more trouble with prolapsing hemorrhoids. He reports occasional bleeding. He is using MiraLAX and milk of magnesia to help keep his bowel movements soft. He denies any frequent constipation or straining.   Past Surgical History (Tanisha A. Brown, RMA; 02/26/2020 10:48 AM) No pertinent past surgical history  Diagnostic Studies History (Tanisha A. Brown, RMA; 02/26/2020 10:48 AM) Colonoscopy never  Allergies (Tanisha A. Brown, RMA; 02/26/2020 10:49 AM) No Known Drug Allergies [03/13/2015]: Allergies Reconciled  Medication History (Tanisha A. Brown, RMA; 02/26/2020 10:49 AM) No Current Medications Medications Reconciled  Social History (Tanisha A. Brown, RMA; 02/26/2020 10:48 AM) Alcohol use Moderate alcohol use, Occasional alcohol use. No drug use Tobacco use Never smoker.  Family History (Tanisha A. Brown, RMA; 02/26/2020 10:48 AM) Family history unknown First Degree Relatives     Review of Systems (Tanisha A. Brown RMA; 02/26/2020 10:48 AM) General Not Present- Appetite Loss, Chills, Fatigue, Fever, Night Sweats, Weight Gain and Weight Loss. Skin Not Present- Change in Wart/Mole, Dryness, Hives, Jaundice, New Lesions, Non-Healing Wounds, Rash and Ulcer. HEENT Present- Earache and Ringing in the Ears. Not Present- Hearing Loss, Hoarseness, Nose Bleed, Oral Ulcers, Seasonal Allergies, Sinus Pain, Sore Throat, Visual Disturbances, Wears glasses/contact lenses and Yellow Eyes. Respiratory Not Present- Bloody sputum, Chronic Cough, Difficulty Breathing, Snoring and Wheezing. Cardiovascular Not Present- Chest Pain, Difficulty Breathing Lying Down,  Leg Cramps, Palpitations, Rapid Heart Rate, Shortness of Breath and Swelling of Extremities. Gastrointestinal Present- Bloody Stool, Constipation, Hemorrhoids and Rectal Pain. Not Present- Abdominal Pain, Bloating, Change in Bowel Habits, Chronic diarrhea, Difficulty Swallowing, Excessive gas, Gets full quickly at meals, Indigestion, Nausea and Vomiting. Male Genitourinary Not Present- Blood in Urine, Change in Urinary Stream, Frequency, Impotence, Nocturia, Painful Urination, Urgency and Urine Leakage. Musculoskeletal Present- Back Pain. Not Present- Joint Pain, Joint Stiffness, Muscle Pain, Muscle Weakness and Swelling of Extremities. Neurological Not Present- Decreased Memory, Fainting, Headaches, Numbness, Seizures, Tingling, Tremor, Trouble walking and Weakness. Psychiatric Not Present- Anxiety, Bipolar, Change in Sleep Pattern, Depression, Fearful and Frequent crying. Endocrine Not Present- Cold Intolerance, Excessive Hunger, Hair Changes, Heat Intolerance, Hot flashes and New Diabetes.  Vitals (Tanisha A. Brown RMA; 02/26/2020 10:49 AM) 02/26/2020 10:49 AM Weight: 249.6 lb Height: 70in Body Surface Area: 2.29 m Body Mass Index: 35.81 kg/m  Temp.: 98.2F  Pulse: 86 (Regular)  BP: 126/84(Sitting, Left Arm, Standard)        Physical Exam (Lonza Shimabukuro MD; 02/26/2020 11:09 AM)  General Mental Status-Alert. General Appearance-Consistent with stated age. Hydration-Well hydrated. Voice-Normal.  Rectal Anorectal Exam External - Note: Small skin tags noted. Internal - normal sphincter tone. Note: No masses palpated. Proctoscopic exam-Internal hemorrhoids(Inflamed, grade 2 left lateral hemorrhoid). Note: unable to perform proctoscopic exam due to pain  Neurologic Neurologic evaluation reveals -alert and oriented x 3 with no impairment of recent or remote memory. Mental Status-Normal.    Assessment & Plan (Prakash Kimberling MD; 02/26/2020 11:08  AM)  PROLAPSED INTERNAL HEMORRHOIDS, GRADE 2 (K64.1) Impression: 33-year-old male who I last saw approximately 5 years ago for anal pain. He was noted to have grade 2 hemorrhoids and given instructions on managing these medically. He has continued this but over the past few months he has   noticed worsening prolapse. On exam today, he appears to have at least one grade 2, large internal hemorrhoid. Exam was difficult to do due to a long anal canal. I recommended an anal exam under anesthesia with transient hemorrhoidal tear internalization. We did discuss hemorrhoidectomy and rubber band ligation as other alternatives. I do not think that these would be good choices in him due to his body habitus and minimal external symptoms.  Current Plans

## 2020-03-20 ENCOUNTER — Encounter (HOSPITAL_BASED_OUTPATIENT_CLINIC_OR_DEPARTMENT_OTHER): Payer: Self-pay | Admitting: General Surgery

## 2020-03-20 ENCOUNTER — Other Ambulatory Visit: Payer: Self-pay

## 2020-03-20 NOTE — Progress Notes (Signed)
Spoke w/ via phone for pre-op interview--- PT Lab needs dos----   no            Lab results------ no COVID test ------ 03-22-2020 @ 0915 Arrive at ------- 1000 NPO after ------ MN Medications to take morning of surgery ----- NONE Diabetic medication ----- n/a Patient Special Instructions ----- n/a Pre-Op special Istructions ----- n/a Patient verbalized understanding of instructions that were given at this phone interview. Patient denies shortness of breath, chest pain, fever, cough a this phone interview.

## 2020-03-23 ENCOUNTER — Other Ambulatory Visit (HOSPITAL_COMMUNITY)
Admission: RE | Admit: 2020-03-23 | Discharge: 2020-03-23 | Disposition: A | Discharge status: Home / Self Care | Payer: 59 | Source: Ambulatory Visit | Attending: General Surgery | Admitting: General Surgery

## 2020-03-23 DIAGNOSIS — Z20822 Contact with and (suspected) exposure to covid-19: Secondary | ICD-10-CM | POA: Insufficient documentation

## 2020-03-23 DIAGNOSIS — Z01812 Encounter for preprocedural laboratory examination: Secondary | ICD-10-CM | POA: Diagnosis present

## 2020-03-23 LAB — SARS CORONAVIRUS 2 (TAT 6-24 HRS): SARS Coronavirus 2: NEGATIVE

## 2020-03-27 ENCOUNTER — Encounter (HOSPITAL_BASED_OUTPATIENT_CLINIC_OR_DEPARTMENT_OTHER): Payer: Self-pay | Admitting: General Surgery

## 2020-03-27 ENCOUNTER — Encounter (HOSPITAL_BASED_OUTPATIENT_CLINIC_OR_DEPARTMENT_OTHER)
Admission: RE | Disposition: A | Discharge status: Home / Self Care | Payer: Self-pay | Source: Home / Self Care | Attending: General Surgery

## 2020-03-27 ENCOUNTER — Ambulatory Visit (HOSPITAL_BASED_OUTPATIENT_CLINIC_OR_DEPARTMENT_OTHER): Payer: 59 | Admitting: Anesthesiology

## 2020-03-27 ENCOUNTER — Other Ambulatory Visit: Payer: Self-pay

## 2020-03-27 ENCOUNTER — Ambulatory Visit (HOSPITAL_BASED_OUTPATIENT_CLINIC_OR_DEPARTMENT_OTHER)
Admission: RE | Admit: 2020-03-27 | Discharge: 2020-03-27 | Disposition: A | Discharge status: Home / Self Care | Payer: 59 | Attending: General Surgery | Admitting: General Surgery

## 2020-03-27 DIAGNOSIS — K641 Second degree hemorrhoids: Secondary | ICD-10-CM | POA: Diagnosis not present

## 2020-03-27 DIAGNOSIS — J449 Chronic obstructive pulmonary disease, unspecified: Secondary | ICD-10-CM | POA: Insufficient documentation

## 2020-03-27 DIAGNOSIS — K642 Third degree hemorrhoids: Secondary | ICD-10-CM | POA: Insufficient documentation

## 2020-03-27 HISTORY — DX: Other hemorrhoids: K64.8

## 2020-03-27 HISTORY — PX: TRANSANAL HEMORRHOIDAL DEARTERIALIZATION: SHX6136

## 2020-03-27 HISTORY — DX: Unspecified osteoarthritis, unspecified site: M19.90

## 2020-03-27 SURGERY — TRANSANAL HEMORRHOIDAL DEARTERIALIZATION
Anesthesia: Monitor Anesthesia Care | Site: Rectum

## 2020-03-27 MED ORDER — BUPIVACAINE LIPOSOME 1.3 % IJ SUSP: 20.0000 mL | Freq: Once | INTRAMUSCULAR | Status: DC

## 2020-03-27 MED ORDER — DIPHENHYDRAMINE HCL 50 MG/ML IJ SOLN
INTRAMUSCULAR | Status: DC | PRN
Start: 2020-03-27 — End: 2020-03-27
  Administered 2020-03-27: 12.5 mg via INTRAVENOUS

## 2020-03-27 MED ORDER — PROPOFOL 500 MG/50ML IV EMUL
INTRAVENOUS | Status: DC | PRN
  Administered 2020-03-27: 100 ug/kg/min via INTRAVENOUS

## 2020-03-27 MED ORDER — KETOROLAC TROMETHAMINE 30 MG/ML IJ SOLN
INTRAMUSCULAR | Status: AC
  Filled 2020-03-27: qty 1

## 2020-03-27 MED ORDER — DEXMEDETOMIDINE HCL 200 MCG/2ML IV SOLN
INTRAVENOUS | Status: DC | PRN
  Administered 2020-03-27: 12 ug via INTRAVENOUS
  Administered 2020-03-27: 8 ug via INTRAVENOUS

## 2020-03-27 MED ORDER — ONDANSETRON HCL 4 MG/2ML IJ SOLN: 4.0000 mg | Freq: Once | INTRAMUSCULAR | Status: DC | PRN

## 2020-03-27 MED ORDER — LIDOCAINE 5 % EX OINT
TOPICAL_OINTMENT | CUTANEOUS | Status: DC | PRN
  Administered 2020-03-27: 1 via TOPICAL

## 2020-03-27 MED ORDER — ACETAMINOPHEN 500 MG PO TABS
ORAL_TABLET | ORAL | Status: AC
  Filled 2020-03-27: qty 2

## 2020-03-27 MED ORDER — CELECOXIB 200 MG PO CAPS
200.0000 mg | ORAL_CAPSULE | ORAL | Status: AC
  Administered 2020-03-27: 200 mg via ORAL

## 2020-03-27 MED ORDER — BUPIVACAINE LIPOSOME 1.3 % IJ SUSP
INTRAMUSCULAR | Status: DC | PRN
  Administered 2020-03-27: 20 mL

## 2020-03-27 MED ORDER — MEPERIDINE HCL 25 MG/ML IJ SOLN: 6.2500 mg | INTRAMUSCULAR | Status: DC | PRN

## 2020-03-27 MED ORDER — OXYCODONE HCL 5 MG PO TABS: 5.0000 mg | ORAL_TABLET | Freq: Once | ORAL | Status: DC | PRN

## 2020-03-27 MED ORDER — MIDAZOLAM HCL 2 MG/2ML IJ SOLN
INTRAMUSCULAR | Status: AC
  Filled 2020-03-27: qty 2

## 2020-03-27 MED ORDER — ONDANSETRON HCL 4 MG/2ML IJ SOLN
INTRAMUSCULAR | Status: AC
  Filled 2020-03-27: qty 2

## 2020-03-27 MED ORDER — SODIUM CHLORIDE 0.9 % IR SOLN
Status: DC | PRN
  Administered 2020-03-27: 500 mL

## 2020-03-27 MED ORDER — LIDOCAINE HCL (CARDIAC) PF 100 MG/5ML IV SOSY
PREFILLED_SYRINGE | INTRAVENOUS | Status: DC | PRN
  Administered 2020-03-27: 60 mg via INTRAVENOUS

## 2020-03-27 MED ORDER — KETOROLAC TROMETHAMINE 30 MG/ML IJ SOLN: 30.0000 mg | Freq: Once | INTRAMUSCULAR | Status: DC | PRN

## 2020-03-27 MED ORDER — ACETAMINOPHEN 160 MG/5ML PO SOLN: 325.0000 mg | ORAL | Status: DC | PRN

## 2020-03-27 MED ORDER — ACETAMINOPHEN 500 MG PO TABS
1000.0000 mg | ORAL_TABLET | ORAL | Status: AC
  Administered 2020-03-27: 1000 mg via ORAL

## 2020-03-27 MED ORDER — FENTANYL CITRATE (PF) 100 MCG/2ML IJ SOLN
INTRAMUSCULAR | Status: DC | PRN
  Administered 2020-03-27 (×2): 50 ug via INTRAVENOUS

## 2020-03-27 MED ORDER — OXYCODONE HCL 5 MG/5ML PO SOLN: 5.0000 mg | Freq: Once | ORAL | Status: DC | PRN

## 2020-03-27 MED ORDER — GABAPENTIN 300 MG PO CAPS
300.0000 mg | ORAL_CAPSULE | ORAL | Status: AC
  Administered 2020-03-27: 300 mg via ORAL

## 2020-03-27 MED ORDER — ONDANSETRON HCL 4 MG/2ML IJ SOLN
INTRAMUSCULAR | Status: DC | PRN
  Administered 2020-03-27: 4 mg via INTRAVENOUS

## 2020-03-27 MED ORDER — OXYCODONE HCL 5 MG PO TABS: 5.0000 mg | ORAL_TABLET | Freq: Four times a day (QID) | ORAL | 0 refills | Status: DC | PRN

## 2020-03-27 MED ORDER — FENTANYL CITRATE (PF) 100 MCG/2ML IJ SOLN: 25.0000 ug | INTRAMUSCULAR | Status: DC | PRN

## 2020-03-27 MED ORDER — MIDAZOLAM HCL 5 MG/5ML IJ SOLN
INTRAMUSCULAR | Status: DC | PRN
  Administered 2020-03-27: 2 mg via INTRAVENOUS

## 2020-03-27 MED ORDER — DIPHENHYDRAMINE HCL 50 MG/ML IJ SOLN
INTRAMUSCULAR | Status: AC
  Filled 2020-03-27: qty 2

## 2020-03-27 MED ORDER — DIAZEPAM 5 MG PO TABS
5.0000 mg | ORAL_TABLET | Freq: Three times a day (TID) | ORAL | 0 refills | Status: AC | PRN
Start: 2020-03-27 — End: 2021-03-27

## 2020-03-27 MED ORDER — LIDOCAINE 2% (20 MG/ML) 5 ML SYRINGE
INTRAMUSCULAR | Status: AC
  Filled 2020-03-27: qty 5

## 2020-03-27 MED ORDER — LACTATED RINGERS IV SOLN: INTRAVENOUS | Status: DC

## 2020-03-27 MED ORDER — DEXMEDETOMIDINE HCL IN NACL 200 MCG/50ML IV SOLN
INTRAVENOUS | Status: AC
  Filled 2020-03-27: qty 50

## 2020-03-27 MED ORDER — ACETAMINOPHEN 325 MG PO TABS: 325.0000 mg | ORAL_TABLET | ORAL | Status: DC | PRN

## 2020-03-27 MED ORDER — CELECOXIB 200 MG PO CAPS
ORAL_CAPSULE | ORAL | Status: AC
  Filled 2020-03-27: qty 1

## 2020-03-27 MED ORDER — SODIUM CHLORIDE 0.9% FLUSH: 3.0000 mL | Freq: Two times a day (BID) | INTRAVENOUS | Status: DC

## 2020-03-27 MED ORDER — BUPIVACAINE-EPINEPHRINE 0.5% -1:200000 IJ SOLN
INTRAMUSCULAR | Status: DC | PRN
  Administered 2020-03-27: 50 mL

## 2020-03-27 MED ORDER — FENTANYL CITRATE (PF) 100 MCG/2ML IJ SOLN
INTRAMUSCULAR | Status: AC
  Filled 2020-03-27: qty 2

## 2020-03-27 MED ORDER — PROPOFOL 500 MG/50ML IV EMUL
INTRAVENOUS | Status: AC
  Filled 2020-03-27: qty 100

## 2020-03-27 MED ORDER — GABAPENTIN 300 MG PO CAPS
ORAL_CAPSULE | ORAL | Status: AC
  Filled 2020-03-27: qty 1

## 2020-03-27 SURGICAL SUPPLY — 61 items
BENZOIN TINCTURE PRP APPL 2/3 (GAUZE/BANDAGES/DRESSINGS) ×3 IMPLANT
BLADE EXTENDED COATED 6.5IN (ELECTRODE) IMPLANT
BLADE HEX COATED 2.75 (ELECTRODE) ×3 IMPLANT
BLADE SURG 10 STRL SS (BLADE) IMPLANT
BRIEF STRETCH FOR OB PAD LRG (UNDERPADS AND DIAPERS) ×3 IMPLANT
CANISTER SUCT 3000ML PPV (MISCELLANEOUS) ×3 IMPLANT
COVER BACK TABLE 60X90IN (DRAPES) ×3 IMPLANT
COVER MAYO STAND STRL (DRAPES) ×3 IMPLANT
COVER WAND RF STERILE (DRAPES) ×3 IMPLANT
DECANTER SPIKE VIAL GLASS SM (MISCELLANEOUS) IMPLANT
DRAPE HYSTEROSCOPY (DRAPE) ×3 IMPLANT
DRAPE LAPAROTOMY 100X72 PEDS (DRAPES) ×3 IMPLANT
DRAPE SHEET LG 3/4 BI-LAMINATE (DRAPES) ×3 IMPLANT
DRAPE UTILITY XL STRL (DRAPES) ×3 IMPLANT
DRSG PAD ABDOMINAL 8X10 ST (GAUZE/BANDAGES/DRESSINGS) ×3 IMPLANT
ELECT REM PT RETURN 9FT ADLT (ELECTROSURGICAL) ×3
ELECTRODE REM PT RTRN 9FT ADLT (ELECTROSURGICAL) ×1 IMPLANT
GAUZE SPONGE 4X4 12PLY STRL (GAUZE/BANDAGES/DRESSINGS) IMPLANT
GAUZE SPONGE 4X4 12PLY STRL LF (GAUZE/BANDAGES/DRESSINGS) ×3 IMPLANT
GLOVE BIO SURGEON STRL SZ 6.5 (GLOVE) ×2 IMPLANT
GLOVE BIO SURGEONS STRL SZ 6.5 (GLOVE) ×1
GLOVE BIOGEL PI IND STRL 7.0 (GLOVE) ×1 IMPLANT
GLOVE BIOGEL PI INDICATOR 7.0 (GLOVE) ×2
GOWN STRL REUS W/TWL 2XL LVL3 (GOWN DISPOSABLE) IMPLANT
GOWN STRL REUS W/TWL LRG LVL3 (GOWN DISPOSABLE) ×6 IMPLANT
GOWN STRL REUS W/TWL XL LVL3 (GOWN DISPOSABLE) ×3 IMPLANT
HEMOSTAT SURGICEL 4X8 (HEMOSTASIS) IMPLANT
HYDROGEN PEROXIDE 16OZ (MISCELLANEOUS) IMPLANT
IV CATH 14GX2 1/4 (CATHETERS) IMPLANT
IV CATH 18G SAFETY (IV SOLUTION) IMPLANT
KIT SIGMOIDOSCOPE (SET/KITS/TRAYS/PACK) IMPLANT
KIT SLIDE ONE PROLAPS HEMORR (KITS) IMPLANT
KIT TURNOVER CYSTO (KITS) ×3 IMPLANT
LEGGING LITHOTOMY PAIR STRL (DRAPES) IMPLANT
LOOP VESSEL MAXI BLUE (MISCELLANEOUS) IMPLANT
LUBRICANT JELLY K Y 4OZ (MISCELLANEOUS) ×3 IMPLANT
NEEDLE HYPO 22GX1.5 SAFETY (NEEDLE) ×3 IMPLANT
NS IRRIG 500ML POUR BTL (IV SOLUTION) ×3 IMPLANT
PAD ARMBOARD 7.5X6 YLW CONV (MISCELLANEOUS) ×3 IMPLANT
PAD PREP 24X48 CUFFED NSTRL (MISCELLANEOUS) IMPLANT
PENCIL BUTTON HOLSTER BLD 10FT (ELECTRODE) ×3 IMPLANT
SET BASIN DAY SURGERY F.S. (CUSTOM PROCEDURE TRAY) ×3 IMPLANT
SPONGE HEMORRHOID 8X3CM (HEMOSTASIS) IMPLANT
SPONGE SURGIFOAM ABS GEL 100 (HEMOSTASIS) IMPLANT
SPONGE SURGIFOAM ABS GEL 12-7 (HEMOSTASIS) IMPLANT
SUT CHROMIC 2 0 SH (SUTURE) IMPLANT
SUT CHROMIC 3 0 SH 27 (SUTURE) IMPLANT
SUT ETHIBOND 0 (SUTURE) IMPLANT
SUT VIC AB 2-0 SH 27 (SUTURE)
SUT VIC AB 2-0 SH 27XBRD (SUTURE) IMPLANT
SUT VIC AB 2-0 UR6 27 (SUTURE) IMPLANT
SUT VIC AB 3-0 SH 27 (SUTURE)
SUT VIC AB 3-0 SH 27XBRD (SUTURE) IMPLANT
SYR 10ML LL (SYRINGE) IMPLANT
SYR CONTROL 10ML LL (SYRINGE) ×3 IMPLANT
TOWEL OR 17X26 10 PK STRL BLUE (TOWEL DISPOSABLE) ×3 IMPLANT
TRAY DSU PREP LF (CUSTOM PROCEDURE TRAY) ×3 IMPLANT
TUBE CONNECTING 12'X1/4 (SUCTIONS) ×1
TUBE CONNECTING 12X1/4 (SUCTIONS) ×2 IMPLANT
UNDERPAD 30X30 (UNDERPADS AND DIAPERS) ×3 IMPLANT
YANKAUER SUCT BULB TIP NO VENT (SUCTIONS) ×3 IMPLANT

## 2020-03-27 NOTE — Anesthesia Preprocedure Evaluation (Signed)
Anesthesia Evaluation  Patient identified by MRN, date of birth, ID band Patient awake    Reviewed: Allergy & Precautions, NPO status , Patient's Chart, lab work & pertinent test results  Airway Mallampati: II       Dental no notable dental hx. (+) Teeth Intact   Pulmonary COPD,    Pulmonary exam normal breath sounds clear to auscultation       Cardiovascular negative cardio ROS Normal cardiovascular exam Rhythm:Regular Rate:Normal     Neuro/Psych negative neurological ROS  negative psych ROS   GI/Hepatic negative GI ROS, Neg liver ROS,   Endo/Other  negative endocrine ROS  Renal/GU negative Renal ROS  negative genitourinary   Musculoskeletal   Abdominal (+) + obese,   Peds negative pediatric ROS (+)  Hematology negative hematology ROS (+)   Anesthesia Other Findings   Reproductive/Obstetrics negative OB ROS                             Anesthesia Physical Anesthesia Plan  ASA: II  Anesthesia Plan: MAC   Post-op Pain Management:    Induction:   PONV Risk Score and Plan: Ondansetron, TIVA and Propofol infusion  Airway Management Planned: Mask, Simple Face Mask, Natural Airway and Nasal Cannula  Additional Equipment: None  Intra-op Plan:   Post-operative Plan:   Informed Consent: I have reviewed the patients History and Physical, chart, labs and discussed the procedure including the risks, benefits and alternatives for the proposed anesthesia with the patient or authorized representative who has indicated his/her understanding and acceptance.       Plan Discussed with: CRNA  Anesthesia Plan Comments:         Anesthesia Quick Evaluation

## 2020-03-27 NOTE — Transfer of Care (Signed)
Immediate Anesthesia Transfer of Care Note  Patient: Connor Castillo  Procedure(s) Performed: TRANSANAL HEMORRHOIDAL DEARTERIALIZATION (N/A Rectum) ANAL EXAM UNDER ANESTHESIA (N/A Rectum)  Patient Location: PACU  Anesthesia Type:MAC  Level of Consciousness: drowsy  Airway & Oxygen Therapy: Patient Spontanous Breathing and Patient connected to face mask oxygen  Post-op Assessment: Report given to RN and Post -op Vital signs reviewed and stable  Post vital signs: Reviewed and stable  Last Vitals:  Vitals Value Taken Time  BP 110/54 03/27/20 1220  Temp    Pulse 65 03/27/20 1221  Resp 15 03/27/20 1221  SpO2 96 % 03/27/20 1221  Vitals shown include unvalidated device data.  Last Pain:  Vitals:   03/27/20 1028  TempSrc: Oral  PainSc: 0-No pain      Patients Stated Pain Goal: 5 (56/43/32 9518)  Complications: No complications documented.

## 2020-03-27 NOTE — Interval H&P Note (Signed)
History and Physical Interval Note:  03/27/2020 11:12 AM  Connor Castillo  has presented today for surgery, with the diagnosis of PROLAPSING INTERNAL HEMORRHOIDS.  The various methods of treatment have been discussed with the patient and family. After consideration of risks, benefits and other options for treatment, the patient has consented to  Procedure(s): TRANSANAL HEMORRHOIDAL DEARTERIALIZATION (N/A) ANAL EXAM UNDER ANESTHESIA (N/A) as a surgical intervention.  The patient's history has been reviewed, patient examined, no change in status, stable for surgery.  I have reviewed the patient's chart and labs.  Questions were answered to the patient's satisfaction.     Vanita Panda, MD  Colorectal and General Surgery [facility]

## 2020-03-27 NOTE — Discharge Instructions (Addendum)
ANORECTAL SURGERY: POST OP INSTRUCTIONS 1. Take your usually prescribed home medications unless otherwise directed. 2. DIET: During the first few hours after surgery sip on some liquids until you are able to urinate.  It is normal to not urinate for several hours after this surgery.  If you feel uncomfortable, please contact the office for instructions.  After you are able to urinate,you may eat, if you feel like it.  Follow a light bland diet the first 24 hours after arrival home, such as soup, liquids, crackers, etc.  Be sure to include lots of fluids daily (6-8 glasses).  Avoid fast food or heavy meals, as your are more likely to get nauseated.  Eat a low fat diet the next few days after surgery.  Limit caffeine intake to 1-2 servings a day. 3. PAIN CONTROL: a. Pain is best controlled by a usual combination of several different methods TOGETHER: i. Muscle relaxation 1.  Soak in a warm bath (or Sitz bath) three times a day and after bowel movements.  Continue to do this until all pain is resolved. 2. Take the muscle relaxer (Valium) every 6 hours for the first 2 days after surgery  ii. Over the counter pain medication iii. Prescription pain medication b. Most patients will experience some swelling and discomfort in the anus/rectal area and incisions.  Heat such as warm towels, sitz baths, warm baths, etc to help relax tight/sore spots and speed recovery.  Some people prefer to use ice, especially in the first couple days after surgery, as it may decrease the pain and swelling, or alternate between ice & heat.  Experiment to what works for you.  Swelling and bruising can take several weeks to resolve.  Pain can take even longer to completely resolve. c. It is helpful to take an over-the-counter pain medication regularly for the first few weeks.  Choose one of the following that works best for you: i. Naproxen (Aleve, etc)  Two 220mg  tabs twice a day ii. Ibuprofen (Advil, etc) Three 200mg  tabs four  times a day (every meal & bedtime) d. A  prescription for pain medication (such as percocet, oxycodone, hydrocodone, etc) should be given to you upon discharge.  Take your pain medication as prescribed.  i. If you are having problems/concerns with the prescription medicine (does not control pain, nausea, vomiting, rash, itching, etc), please call us [phone] to see if we need to switch you to a different pain medicine that will work better for you and/or control your side effect better. ii. If you need a refill on your pain medication, please contact your pharmacy.  They will contact our office to request authorization. Prescriptions will not be filled after 5 pm or on week-ends. 4. KEEP YOUR BOWELS REGULAR and AVOID CONSTIPATION a. The goal is one to two soft bowel movements a day.  You should at least have a bowel movement every other day. b. Avoid getting constipated.  Between the surgery and the pain medications, it is common to experience some constipation. This can be very painful after rectal surgery.  Increasing fluid intake and taking a fiber supplement (such as Metamucil, Citrucel, FiberCon, etc) 1-2 times a day regularly will usually help prevent this problem from occurring.  A stool softener like colace is also recommended.  This can be purchased over the counter at your pharmacy.  You can take it up to 3 times a day.  If you do not have a bowel movement after 24 hrs since your surgery,  take one does of milk of magnesia.  If you still haven't had a bowel movement 8-12 hours after that dose, take another dose.  If you don't have a bowel movement 48 hrs after surgery, purchase a Fleets enema from the drug store and administer gently per package instructions.  If you still are having trouble with your bowel movements after that, please call the office for further instructions. c. If you develop diarrhea or have many loose bowel movements, simplify your diet to bland foods & liquids for a few  days.  Stop any stool softeners and decrease your fiber supplement.  Switching to mild anti-diarrheal medications (Kayopectate, Pepto Bismol) can help.  If this worsens or does not improve, please call us.  5. Wound Care a. Remove your bandages before your first bowel movement or 8 hours after surgery.     b. Remove any wound packing material at this tim,e as well.  You do not need to repack the wound unless instructed otherwise.  Wear an absorbent pad or soft cotton gauze in your underwear to catch any drainage and help keep the area clean. You should change this every 2-3 hours while awake. c. Keep the area clean and dry.  Bathe / shower every day, especially after bowel movements.  Keep the area clean by showering / bathing over the incision / wound.   It is okay to soak an open wound to help wash it.  Wet wipes or showers / gentle washing after bowel movements is often less traumatic than regular toilet paper. d. Dennis Bast may have some styrofoam-like soft packing in the rectum which will come out with the first bowel movement.  e. You will often notice bleeding with bowel movements.  This should slow down by the end of the first week of surgery f. Expect some drainage.  This should slow down, too, by the end of the first week of surgery.  Wear an absorbent pad or soft cotton gauze in your underwear until the drainage stops. g. Do Not sit on a rubber or pillow ring.  This can make you symptoms worse.  You may sit on a soft pillow if needed.  6. ACTIVITIES as tolerated:   a. You may resume regular (light) daily activities beginning the next day--such as daily self-care, walking, climbing stairs--gradually increasing activities as tolerated.  If you can walk 30 minutes without difficulty, it is safe to try more intense activity such as jogging, treadmill, bicycling, low-impact aerobics, swimming, etc. b. Save the most intensive and strenuous activity for last such as sit-ups, heavy lifting, contact sports,  etc  Refrain from any heavy lifting or straining until you are off narcotics for pain control.   c. You may drive when you are no longer taking prescription pain medication, you can comfortably sit for long periods of time, and you can safely maneuver your car and apply brakes. d. Dennis Bast may have sexual intercourse when it is comfortable.  7. FOLLOW UP in our office a. Please call [facility] at [phone] to set up an appointment to see your surgeon in the office for a follow-up appointment approximately 3-4 weeks after your surgery. b. Make sure that you call for this appointment the day you arrive home to insure a convenient appointment time. 10. IF YOU HAVE DISABILITY OR FAMILY LEAVE FORMS, BRING THEM TO THE OFFICE FOR PROCESSING.  DO NOT GIVE THEM TO YOUR DOCTOR.     WHEN TO CALL us [phone]: 1. Poor pain control  2. Reactions / problems with new medications (rash/itching, nausea, etc)  3. Fever over 101.5 F (38.5 C) 4. Inability to urinate 5. Nausea and/or vomiting 6. Worsening swelling or bruising 7. Continued bleeding from incision. 8. Increased pain, redness, or drainage from the incision  The clinic staff is available to answer your questions during regular business hours (8:30am-5pm).  Please don't hesitate to call and ask to speak to one of our nurses for clinical concerns.   A surgeon from [facility] is always on call at the hospitals   If you have a medical emergency, go to the nearest emergency room or call 911.    [facility] [address], [place], [place]  [address] ? MAIN: [phone] ? TOLL FREE: [phone] ? FAX [phone] [email or web address]      Do not take any Tylenol until after 4:30 pm today. Do not take any nonsteroidal anti inflammatories until after 4:30 pm today.    Post Anesthesia Home Care Instructions  Activity: Get plenty of rest for the remainder of the day. A responsible  individual must stay with you for 24 hours following the procedure.  For the next 24 hours, DO NOT: -Drive a car -Advertising copywriter -Drink alcoholic beverages -Take any medication unless instructed by your physician -Make any legal decisions or sign important papers.  Meals: Start with liquid foods such as gelatin or soup. Progress to regular foods as tolerated. Avoid greasy, spicy, heavy foods. If nausea and/or vomiting occur, drink only clear liquids until the nausea and/or vomiting subsides. Call your physician if vomiting continues.  Special Instructions/Symptoms: Your throat may feel dry or sore from the anesthesia or the breathing tube placed in your throat during surgery. If this causes discomfort, gargle with warm salt water. The discomfort should disappear within 24 hours.  If you had a scopolamine patch placed behind your ear for the management of post- operative nausea and/or vomiting:  1. The medication in the patch is effective for 72 hours, after which it should be removed.  Wrap patch in a tissue and discard in the trash. Wash hands thoroughly with soap and water. 2. You may remove the patch earlier than 72 hours if you experience unpleasant side effects which may include dry mouth, dizziness or visual disturbances. 3. Avoid touching the patch. Wash your hands with soap and water after contact with the patch.     Information for Discharge Teaching: EXPAREL (bupivacaine liposome injectable suspension)   Your surgeon or anesthesiologist gave you EXPAREL(bupivacaine) to help control your pain after surgery.   EXPAREL is a local anesthetic that provides pain relief by numbing the tissue around the surgical site.  EXPAREL is designed to release pain medication over time and can control pain for up to 72 hours.  Depending on how you respond to EXPAREL, you may require less pain medication during your recovery.  Possible side effects:  Temporary loss of sensation or ability  to move in the area where bupivacaine was injected.  Nausea, vomiting, constipation  Rarely, numbness and tingling in your mouth or lips, lightheadedness, or anxiety may occur.  Call your doctor right away if you think you may be experiencing any of these sensations, or if you have other questions regarding possible side effects.  Follow all other discharge instructions given to you by your surgeon or nurse. Eat a healthy diet and drink plenty of water or other fluids.  If you return to the hospital for any reason  within 96 hours following the administration of EXPAREL, it is important for health care providers to know that you have received this anesthetic. A teal colored band has been placed on your arm with the date, time and amount of EXPAREL you have received in order to alert and inform your health care providers. Please leave this armband in place for the full 96 hours following administration, and then you may remove the band.

## 2020-03-27 NOTE — Op Note (Signed)
03/27/2020  12:14 PM  PATIENT:  Connor Castillo  34 y.o. male  Patient Care Team: Delsa Grana, PA-C as PCP - General (Family Medicine)  PRE-OPERATIVE DIAGNOSIS:  PROLAPSING INTERNAL HEMORRHOIDS  POST-OPERATIVE DIAGNOSIS:  Grade 3 bleeding hemorrhoids  PROCEDURE:  TRANSANAL HEMORRHOIDAL DEARTERIALIZATION ANAL EXAM UNDER ANESTHESIA   Surgeon(s): Leighton Ruff, MD  ASSISTANT: none   ANESTHESIA:   local and MAC  EBL: 20 ml Total I/O In: 500 [I.V.:500] Out: -   DRAINS: none   SPECIMEN:  No Specimen  DISPOSITION OF SPECIMEN:  N/A  COUNTS:  YES  PLAN OF CARE: Discharge to home after PACU  PATIENT DISPOSITION:  PACU - hemodynamically stable.  INDICATION: 34 y.o. male who presents to my office with ongoing rectal bleeding.  Patient was noted to have grade 2 and 3 hemorrhoids.  I recommended surgical dearterialization and hemorrhoidal pexy as a possible solution to his symptoms.   OR FINDINGS: Grade 3 R posterior hemorrhoid, grade 2 left lateral and right anterior hemorrhoids with active inflammation and bleeding  Description: Informed consent was confirmed. Patient underwent general anesthesia without difficulty. Patient was placed into lithotomy positioning.  The perianal region was prepped and draped in sterile fashion. Surgical time out confirmed or plan.  I did digital rectal examination and then transitioned over to anoscopy to get a sense of the anatomy.  The patient had a grade 3 right posterior hemorrhoid and grade 2 left lateral and right anterior hemorrhoids  I proceeded to ligate the hemorrhoidal arteries in the classic hexagonal anatomical pattern  (right posterior/lateral/anterior, left posterior /lateral/anterior).   I used a 2-0 Vicryl suture in a figure-of-eight fashion around 6 cm proximal to the anal verge. I then ran that stitch longitudinally more distally to the dentate line. I then tied that stitch down to cause a hemorrhoidopexy. I did that for all  6 locations.  I placed an additional suture in the left anterior lateral position to help pexy the remaining hemorrhoid column. At completion of this, all hemorrhoids were reduced into the rectum.  There is no more prolapse. External anatomy looked normal except for a small posterior skin tag.  I repeated anoscopy and examination.   Hemostasis was good.  Patient is being extubated go to recovery room.  I am about to discuss the patient's status to the family.

## 2020-03-27 NOTE — Anesthesia Postprocedure Evaluation (Signed)
Anesthesia Post Note  Patient: Connor Castillo  Procedure(s) Performed: TRANSANAL HEMORRHOIDAL DEARTERIALIZATION (N/A Rectum) ANAL EXAM UNDER ANESTHESIA (N/A Rectum)     Patient location during evaluation: PACU Anesthesia Type: MAC Level of consciousness: sedated Pain management: pain level controlled Vital Signs Assessment: post-procedure vital signs reviewed and stable Respiratory status: spontaneous breathing Cardiovascular status: stable Postop Assessment: no apparent nausea or vomiting Anesthetic complications: no   No complications documented.  Last Vitals:  Vitals:   03/27/20 1300 03/27/20 1315  BP: 131/79 (!) 141/76  Pulse: (!) 54 (!) 51  Resp: 14 15  Temp:    SpO2: 99% 99%    Last Pain:  Vitals:   03/27/20 1315  TempSrc:   PainSc: 0-No pain   Pain Goal: Patients Stated Pain Goal: 5 (03/27/20 1028)                 Huston Foley

## 2020-03-28 ENCOUNTER — Emergency Department (HOSPITAL_COMMUNITY)
Admission: EM | Admit: 2020-03-28 | Discharge: 2020-03-28 | Disposition: A | Discharge status: Home / Self Care | Payer: 59 | Attending: Emergency Medicine | Admitting: Emergency Medicine

## 2020-03-28 ENCOUNTER — Encounter (HOSPITAL_COMMUNITY): Payer: Self-pay

## 2020-03-28 ENCOUNTER — Emergency Department (HOSPITAL_BASED_OUTPATIENT_CLINIC_OR_DEPARTMENT_OTHER)
Admission: EM | Admit: 2020-03-28 | Discharge: 2020-03-28 | Disposition: A | Discharge status: Home / Self Care | Payer: 59 | Source: Home / Self Care | Attending: Emergency Medicine | Admitting: Emergency Medicine

## 2020-03-28 ENCOUNTER — Encounter (HOSPITAL_BASED_OUTPATIENT_CLINIC_OR_DEPARTMENT_OTHER): Payer: Self-pay | Admitting: Emergency Medicine

## 2020-03-28 ENCOUNTER — Other Ambulatory Visit: Payer: Self-pay

## 2020-03-28 DIAGNOSIS — Z5321 Procedure and treatment not carried out due to patient leaving prior to being seen by health care provider: Secondary | ICD-10-CM | POA: Insufficient documentation

## 2020-03-28 DIAGNOSIS — G8918 Other acute postprocedural pain: Secondary | ICD-10-CM | POA: Insufficient documentation

## 2020-03-28 DIAGNOSIS — R339 Retention of urine, unspecified: Secondary | ICD-10-CM | POA: Insufficient documentation

## 2020-03-28 MED ORDER — OXYCODONE HCL 5 MG PO TABS
5.0000 mg | ORAL_TABLET | Freq: Once | ORAL | Status: AC
  Administered 2020-03-28: 5 mg via ORAL
  Filled 2020-03-28: qty 1

## 2020-03-28 MED ORDER — KETOROLAC TROMETHAMINE 60 MG/2ML IM SOLN
60.0000 mg | Freq: Once | INTRAMUSCULAR | Status: AC
  Administered 2020-03-28: 60 mg via INTRAMUSCULAR
  Filled 2020-03-28: qty 2

## 2020-03-28 NOTE — ED Provider Notes (Signed)
[facility] EMERGENCY DEPARTMENT Provider Note   CSN: [id number] Arrival date & time: 03/28/20  0435     History Chief Complaint  Patient presents with  . Rectal Pain    Connor Castillo is a 34 y.o. male.  HPI     Hemorrhoid surgery yesterday Has not been able to get comfortable, can't sit, can't get comfortable, can't sit still, can't sleep  Ibuprofen 200mg  830 last night took valium Has not been able to urinate normally since then (since taking valium) Feels like needs to urinate but can't Called Dr. who says we can place catheter and she will take out later   Past Medical History:  Diagnosis Date  . Arthritis   . Internal hemorrhoid, bleeding     Patient Active Problem List   Diagnosis Date Noted  . Hyperlipidemia 04/26/2019  . Impingement syndrome of left shoulder region 06/17/2018  . Pain in joint of left shoulder 05/11/2018    Past Surgical History:  Procedure Laterality Date  . KNEE ARTHROSCOPY WITH ANTERIOR CRUCIATE LIGAMENT (ACL) REPAIR Left 09-24-2003  @[facility]   dr 09-26-2003  . TRANSANAL HEMORRHOIDAL DEARTERIALIZATION N/A 03/27/2020   Procedure: TRANSANAL HEMORRHOIDAL DEARTERIALIZATION;  Surgeon: Thurston Hole, MD;  Location: [facility];  Service: General;  Laterality: N/A;  . WISDOM TOOTH EXTRACTION         Family History  Problem Relation Age of Onset  . Diabetes Mother   . Hypertension Mother   . Cancer Father 33       Stomach cancer 05/2018  . Cancer Paternal Grandmother        esophageal  . Early death Other     Social History   Tobacco Use  . Smoking status: Never Smoker  . Smokeless tobacco: Never Used  Vaping Use  . Vaping Use: Never used  Substance Use Topics  . Alcohol use: Yes    Comment: occasional  . Drug use: Never    Home Medications Prior to Admission medications   Medication Sig Start Date End Date Taking? Authorizing Provider  diazepam (VALIUM) 5 MG tablet Take 1 tablet (5 mg  total) by mouth every 8 (eight) hours as needed (urinary retention). Do not take at the same time as your narcotics 03/27/20 03/27/21  03/29/20, MD  magnesium hydroxide (MILK OF MAGNESIA) 400 MG/5ML suspension Take by mouth daily as needed for mild constipation.    [provider]  oxyCODONE (OXY IR/ROXICODONE) 5 MG immediate release tablet Take 1-2 tablets (5-10 mg total) by mouth every 6 (six) hours as needed for severe pain. 03/27/20   Romie Levee, MD  Polyethylene Glycol 3350 (MIRALAX PO) Take by mouth as needed.    [provider]    Allergies    Patient has no known allergies.  Review of Systems   Review of Systems  Constitutional: Negative for fever.  HENT: Negative for sore throat.   Respiratory: Negative for shortness of breath.   Cardiovascular: Negative for chest pain.  Gastrointestinal: Positive for constipation (has not had BM since surgery) and rectal pain. Negative for abdominal pain, nausea and vomiting.  Genitourinary: Positive for difficulty urinating. Negative for dysuria.  Musculoskeletal: Negative for back pain and neck stiffness.  Skin: Positive for wound (surgical). Negative for rash.  Neurological: Negative for syncope and headaches.    Physical Exam Updated Vital Signs BP (!) 142/74 (BP Location: Right Arm)   Pulse 87   Temp 98 F (36.7 C) (Oral)   Resp 16  Ht 5\' 10"  (1.778 m)   Wt 111 kg   SpO2 99%   BMI 35.11 kg/m   Physical Exam Vitals and nursing note reviewed.  Constitutional:      General: He is not in acute distress.    Appearance: Normal appearance. He is not ill-appearing, toxic-appearing or diaphoretic.  HENT:     Head: Normocephalic.  Eyes:     Conjunctiva/sclera: Conjunctivae normal.  Cardiovascular:     Rate and Rhythm: Normal rate and regular rhythm.     Pulses: Normal pulses.  Pulmonary:     Effort: Pulmonary effort is normal. No respiratory distress.  Abdominal:     Tenderness: There is abdominal  tenderness (mild fullness/discomfort suprapubic).  Genitourinary:    Comments: Skin tag, normal appearance on rectal, deferred digital exam given recent surgery Musculoskeletal:        General: No deformity or signs of injury.     Cervical back: No rigidity.  Skin:    General: Skin is warm and dry.     Coloration: Skin is not jaundiced or pale.  Neurological:     General: No focal deficit present.     Mental Status: He is alert and oriented to person, place, and time.     ED Results / Procedures / Treatments   Labs (all labs ordered are listed, but only abnormal results are displayed) Labs Reviewed - No data to display  EKG None  Radiology No results found.  Procedures Procedures (including critical care time)  Medications Ordered in ED Medications  ketorolac (TORADOL) injection 60 mg (60 mg Intramuscular Given 03/28/20 0807)  oxyCODONE (Oxy IR/ROXICODONE) immediate release tablet 5 mg (5 mg Oral Given 03/28/20 0810)    ED Course  I have reviewed the triage vital signs and the nursing notes.  Pertinent labs & imaging results that were available during my care of the patient were reviewed by me and considered in my medical decision making (see chart for details).    MDM Rules/Calculators/A&P                          34yo male presents with concern for rectal pain after hemorrhoid surgery yesterday and urinary retention.  500cc urine present on PVR. Catheter placed for comfort and recommend removal as an outpatient with Urology (or Dr. Marcello Moores.)  Discussed pain control with patient and wife, has rx for oxycodone, recommend ibuprofen/tylenol and using rx for pain.   Final Clinical Impression(s) / ED Diagnoses Final diagnoses:  Post-operative pain  Urinary retention    Rx / DC Orders ED Discharge Orders    None       Gareth Morgan, MD 03/28/20 2258

## 2020-03-28 NOTE — ED Notes (Signed)
Pt much more comfortable following medications and foley catheter placement.  Pt and family Instructed on pain management, Foley cath care and follow up with urologist.  Discussed reasons for return to ER related to catheter.  Both verbalized good understanding.  Pt provided leg bag for use at home.

## 2020-03-28 NOTE — ED Triage Notes (Signed)
Arrived POV from home. Patient reports he had Hemorrhoids removed on Wednesday at noon and that now the pain is uncontrollable. patient states none of the methods he has tried at ome to relieve the pain is working

## 2020-03-28 NOTE — ED Triage Notes (Signed)
Patient arrived via POV c/o rectal pain post hemorrhoid surgery yesterday. Patient states uncomfortable pain like numbing wore off. Patient states took prescribed valium but not oxycotin. Patient is AO x 4, VS WDL, normal gait.

## 2020-03-28 NOTE — ED Notes (Addendum)
Post void bladder scan  Dr Dalene Seltzer made aware.

## 2020-03-28 NOTE — Discharge Instructions (Addendum)
Thank you for seeing [place].  We hope your pain improves soon. You may follow up with Dr. Maisie Fus as scheduled and if you are unable to have catheter removed, follow up with Urology.    Take Tylenol 1000 mg 4 times a day as needed for 1 week. This is the maximum dose of Tylenol (acetaminophen) you can take from all sources. Please check other over-the-counter medications and prescriptions to ensure you are not taking other medications that contain acetaminophen.  You may also take ibuprofen 400 mg 6 times a day alternating with or at the same time as tylenol.  Take oxycodone as needed for breakthrough pain.  This medication can be addicting, sedating and cause constipation.  Taking a stool softener and miralax if you are taking this medication.

## 2020-03-28 NOTE — ED Notes (Signed)
Pt requesting pain medication, stating ice application not effective.  Awaiting evaluation by ED provider.

## 2021-02-12 ENCOUNTER — Other Ambulatory Visit: Payer: Self-pay

## 2021-02-13 LAB — SEMEN ANALYSIS, BASIC
Appearance: NORMAL
Concentration, Sperm: 53.4 x10E6/mL (ref >14.9)
Immotile Sperm: 35 %
Leukocyte Concentration: >1 x10E6/mL — ABNORMAL HIGH (ref <1.00)
Non-Progressive (NP): 16 %
Normal Morphology-Strict: 12 % (ref >3)
Progressive Motility (PR): 49 % (ref >31)
Progressively Motile Sperm: 31.2 x10E6
Time Collected: 820
Time Received: 1000
Time Since Last Emission: 3 days
Total Motile Sperm: 41.4 x10E6
Total Motility (PR+NP): 65 % (ref >39)
Total Sperm in Ejaculate: 64.1 x10E6 (ref >38.9)
Volume: 1.2 mL — ABNORMAL LOW (ref >1.4)
pH: 8.5 (ref >7.1)

## 2022-09-15 ENCOUNTER — Ambulatory Visit: Payer: 59 | Admitting: Podiatry

## 2022-09-15 ENCOUNTER — Encounter: Payer: Self-pay | Admitting: Podiatry

## 2022-09-15 DIAGNOSIS — B353 Tinea pedis: Secondary | ICD-10-CM

## 2022-09-15 MED ORDER — KETOCONAZOLE 2 % EX CREA: 1.0000 | TOPICAL_CREAM | Freq: Two times a day (BID) | CUTANEOUS | 2 refills | Status: DC

## 2022-09-15 NOTE — Progress Notes (Signed)
  Subjective:  Patient ID: Connor Castillo, male    DOB: 1985/11/19,  MRN: [id number]  Chief Complaint  Patient presents with   Tinea Pedis    (np) Right foot 4th and 5th toe have cuts between them    36 y.o. male presents with the above complaint. History confirmed with patient.  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  Assessment:   1. Tinea pedis of both feet      Plan:  Patient was evaluated and treated and all questions answered.  Discussed the etiology and treatment options for tinea pedis.  Discussed topical and oral treatment.  Recommended topical treatment with 2% ketoconazole cream.  This was sent to the patient's pharmacy.  Also discussed appropriate foot hygiene, use of antifungal spray such as Tinactin in shoes, as well as cleaning her foot surfaces such as showers and bathroom floors with bleach.   Return if symptoms worsen or fail to improve.

## 2024-02-03 ENCOUNTER — Encounter: Payer: Self-pay | Admitting: Podiatry

## 2024-02-03 ENCOUNTER — Telehealth: Payer: Self-pay

## 2024-02-03 ENCOUNTER — Ambulatory Visit: Admitting: Podiatry

## 2024-02-03 VITALS — Ht 70.0 in | Wt 244.7 lb

## 2024-02-03 DIAGNOSIS — R2242 Localized swelling, mass and lump, left lower limb: Secondary | ICD-10-CM

## 2024-02-03 NOTE — Patient Instructions (Signed)
 Call [facility] on [facility] in [place] next week to get your ultrasound scheduled.  I will let you know the results of the ultrasound show and what we should do that after.  It likely will need to be drained or removed

## 2024-02-03 NOTE — Telephone Encounter (Signed)
-----   Message from Floyce Hutching sent at 02/03/2024 12:32 PM EDT ----- Another [facility] ultrasound referral if you can send.  Thank you!

## 2024-02-03 NOTE — Progress Notes (Signed)
  Subjective:  Patient ID: Connor Castillo, male    DOB: Jun 10, 1986,  MRN: [id number]  Chief Complaint  Patient presents with   Ganglion Cyst    RM2 possible ganglion cyst between great toe and second toe    38 y.o. male presents with the above complaint. History confirmed with patient.  Presents today with a new issue on his left foot of a painful mass on the left great toe  Objective:  Physical Exam: warm, good capillary refill, no trophic changes or ulcerative lesions, normal DP and PT pulses, normal sensory exam, and firm mobile subcutaneous mass on the lateral hallux.  Assessment:   1. Mass of left foot      Plan:  Patient was evaluated and treated and all questions answered.  He has a new mass on the left lateral hallux.  I discussed with him that likely is a ganglion cyst but this is an odd location for this they typically are on the dorsal extensors or plantar flexors or overlying a joint and this is none of the above.  Possibility there is a small schwannoma here causing this as well it does have some radiating pain when palpating it.  I recommended evaluating with an ultrasound and musculoskeletal ultrasound referral was placed.  I will see him back after the study for further treatment including possible aspiration and drainage injection of the cyst or excision if it is a solid tumor  Return for after ultrasound to review.

## 2024-02-03 NOTE — Telephone Encounter (Signed)
 US  order, office note and demographics faxed to [facility]  Phone [phone], fax [phone]

## 2024-03-02 ENCOUNTER — Encounter: Payer: Self-pay | Admitting: Podiatry

## 2024-03-07 ENCOUNTER — Ambulatory Visit: Payer: Self-pay | Admitting: Podiatry

## 2024-03-08 NOTE — Progress Notes (Signed)
 Called Pt and he is sch'ed for 10:45 am 5/29

## 2024-03-09 ENCOUNTER — Ambulatory Visit (INDEPENDENT_AMBULATORY_CARE_PROVIDER_SITE_OTHER): Admitting: Podiatry

## 2024-03-09 DIAGNOSIS — R2242 Localized swelling, mass and lump, left lower limb: Secondary | ICD-10-CM

## 2024-03-09 DIAGNOSIS — B353 Tinea pedis: Secondary | ICD-10-CM

## 2024-03-09 DIAGNOSIS — M67472 Ganglion, left ankle and foot: Secondary | ICD-10-CM | POA: Diagnosis not present

## 2024-03-09 DIAGNOSIS — L03031 Cellulitis of right toe: Secondary | ICD-10-CM | POA: Diagnosis not present

## 2024-03-09 MED ORDER — KETOCONAZOLE 2 % EX CREA: 1.0000 | TOPICAL_CREAM | Freq: Two times a day (BID) | CUTANEOUS | 2 refills | Status: AC

## 2024-03-09 MED ORDER — CEPHALEXIN 500 MG PO CAPS: 500.0000 mg | ORAL_CAPSULE | Freq: Three times a day (TID) | ORAL | 0 refills | Status: AC

## 2024-03-09 NOTE — Patient Instructions (Signed)
 Soak Instructions     Place 1/4 cup of epsom salts (or betadine, or white vinegar) in a quart of warm tap water.  continue to soak in the solution for 20 minutes.  This soak should be done twice a day.  Next, remove your foot or feet from solution, blot dry the affected area and apply Neosporin or antibiotic ointment.

## 2024-03-11 NOTE — Progress Notes (Signed)
  Subjective:  Patient ID: Connor Castillo, male    DOB: [birth year],  MRN: [id number]  Chief Complaint  Patient presents with   Ganglion Cyst    Pt stated he is here to follow up on his cyst in between the big toe and 1st toe.     38 y.o. male presents with the above complaint. History confirmed with patient.  Returns after completing the ultrasound.  Also dealing with recurrence of his tinea pedis on the right foot as well as an ingrown nail on the right hallux medial border, he tried to cut it back and it sore Objective:  Physical Exam: warm, good capillary refill, no trophic changes or ulcerative lesions, normal DP and PT pulses, normal sensory exam, and firm mobile subcutaneous mass on the lateral hallux.  Right hallux incurvated medial border pain distally without deep ingrown but hangnail present and tender.  Interdigital tinea pedis right fourth interspace  Ultrasound showed mass consistent with ganglion cyst   Assessment:   1. Ganglion cyst of left foot   2. Tinea pedis of both feet   3. Paronychia of toe of right foot      Plan:  Patient was evaluated and treated and all questions answered.  We reviewed the results of the ultrasound and discussed the presence of the ganglion cyst discussed options including drainage and surgical excision.  I recommended drainage.  Following consent and prepped with Betadine the area was anesthetized with lidocaine  and Marcaine  in a field block 1.5 cc each.  A #11 blade was used to puncture the cyst and its contents were drained which were consistent with a ganglion cyst.  A compression bandage was applied and post care instructions were given.  Again discussed treatment of tinea pedis and foot hygiene.  Rx for ketoconazole  sent to pharmacy  Discussed the developing paronychia he has on the right great toe.  Recommended Epsom salt soaks and instructions were given.  Rx for Keflex  sent to pharmacy.  If not improving in 1 to 2 weeks he  should notify me to return for ingrown toenail removal.  Return if symptoms worsen or fail to improve.

## 2024-06-15 ENCOUNTER — Ambulatory Visit (INDEPENDENT_AMBULATORY_CARE_PROVIDER_SITE_OTHER): Admitting: Podiatry

## 2024-06-15 VITALS — Ht 70.0 in | Wt 244.7 lb

## 2024-06-15 DIAGNOSIS — R2242 Localized swelling, mass and lump, left lower limb: Secondary | ICD-10-CM | POA: Diagnosis not present

## 2024-06-16 NOTE — Progress Notes (Signed)
  Subjective:  Patient ID: ENRIGUE HASHIMI, male    DOB: [birth year],  MRN: [id number]  Chief Complaint  Patient presents with   Ganglion Cyst    Rm 6 Ganglion cyst on right big toe.    38 y.o. male presents with the above complaint. History confirmed with patient.  The cyst has returned and is painful again for him   Objective:  Physical Exam: warm, good capillary refill, no trophic changes or ulcerative lesions, normal DP and PT pulses, normal sensory exam, and firm mobile subcutaneous mass on the lateral hallux.     Ultrasound showed mass consistent with ganglion cyst   Assessment:   1. Mass of left foot      Plan:  Patient was evaluated and treated and all questions answered.  He returns for follow-up with recurrence of his ganglion cyst after drainage and aspiration.  We discussed further treatment options including surgical excision.  He is currently soon-to-be out on FMLA for surgery for his wife and this would be a good time for him to recover from this as well.  We discussed the risk benefits and potential complication including the risk of infection nonhealing as well as recurrence.  We reviewed the recovery process as well.  All questions addressed.  Informed consent signed and reviewed   Surgical plan:  Procedure: - Excision of ganglion of left hallux  Location: - [facility]  Anesthesia plan: - Sedation with local  Postoperative pain plan: -  Tylenol  1000 mg every 6 hours, ibuprofen 600 mg every 6 hours, gabapentin  300 mg every 8 hours x5 days, oxycodone  5 mg 1-2 tabs every 6 hours only as needed  DVT prophylaxis: - None required  WB Restrictions / DME needs: - WBAT in surgical shoe postop  No follow-ups on file.

## 2024-06-20 ENCOUNTER — Encounter: Payer: Self-pay | Admitting: Podiatry

## 2024-06-22 ENCOUNTER — Telehealth: Payer: Self-pay | Admitting: Podiatry

## 2024-06-22 NOTE — Telephone Encounter (Addendum)
 Pt left message yesterday afternoon stating he was wanting to get the surgery on 10/13..   I returned call and left message I did have an opening on 10/13 and to call me back to schedule.  Pt returned call and is scheduled for 10/13

## 2024-06-26 DIAGNOSIS — Z0279 Encounter for issue of other medical certificate: Secondary | ICD-10-CM

## 2024-06-26 NOTE — Telephone Encounter (Signed)
 recd [facility] forms. called to adv would fax and email him copy for his records RTW 10/02/24 DOS 07/24/24

## 2024-06-29 ENCOUNTER — Telehealth: Payer: Self-pay | Admitting: Podiatry

## 2024-06-29 NOTE — Telephone Encounter (Signed)
 DOS- 07/24/2024  EXC GANGLION TOE LT- 71907  [employer] EFFECTIVE DATE- 10/13/2023  DEDUCTIBLE- $500 REMAINING- $500 OOP- $3000 REMAINING- $2817.70 FAMILY DEDUCTIBLE- $1000 REMAINING- $500 FAMILY OOP- $6000 REMAINING- $2817.70 COINSURANCE- 20%  PER [employer] WEBSITE, NO PRIOR AUTH IS REQUIRED FOR CPT CODE 71907. DECISION ID# [id number]

## 2024-07-24 ENCOUNTER — Other Ambulatory Visit: Payer: Self-pay | Admitting: Podiatry

## 2024-07-24 DIAGNOSIS — R2242 Localized swelling, mass and lump, left lower limb: Secondary | ICD-10-CM | POA: Diagnosis not present

## 2024-07-24 MED ORDER — IBUPROFEN 600 MG PO TABS: 600.0000 mg | ORAL_TABLET | Freq: Four times a day (QID) | ORAL | 0 refills | Status: AC | PRN

## 2024-07-24 MED ORDER — ACETAMINOPHEN 500 MG PO TABS: 1000.0000 mg | ORAL_TABLET | Freq: Four times a day (QID) | ORAL | 0 refills | Status: AC | PRN

## 2024-07-24 MED ORDER — OXYCODONE HCL 5 MG PO TABS: 5.0000 mg | ORAL_TABLET | Freq: Two times a day (BID) | ORAL | 0 refills | Status: AC | PRN

## 2024-07-31 ENCOUNTER — Ambulatory Visit (INDEPENDENT_AMBULATORY_CARE_PROVIDER_SITE_OTHER): Admitting: Podiatry

## 2024-07-31 VITALS — Ht 70.0 in | Wt 244.7 lb

## 2024-07-31 DIAGNOSIS — R2242 Localized swelling, mass and lump, left lower limb: Secondary | ICD-10-CM

## 2024-07-31 NOTE — Progress Notes (Signed)
  Subjective:  Patient ID: Connor Castillo, male    DOB: 1986/01/01,  MRN: [id number]  Chief Complaint  Patient presents with   Post-op Follow-up    POST OP - POV # 1 DOS 07/24/24 LT GREAT TOE CYST REMOVAL Surgical site is healing well, all sutures are intact with minimum swelling.     38 y.o. male returns for post-op check.  Doing well pain is managed  Review of Systems: Negative except as noted in the HPI. Denies N/V/F/Ch.   Objective:  There were no vitals filed for this visit. Body mass index is 35.11 kg/m. Constitutional Well developed. Well nourished.  Vascular Foot warm and well perfused. Capillary refill normal to all digits.  Calf is soft and supple, no posterior calf or knee pain, negative Homans' sign  Neurologic Normal speech. Oriented to person, place, and time. Epicritic sensation to light touch grossly present bilaterally.  Dermatologic Skin healing well without signs of infection. Skin edges well coapted without signs of infection.  Orthopedic: No pain or swelling to palpation noted about the surgical site.    Assessment:   1. Mass of left foot    Plan:  Patient was evaluated and treated and all questions answered.  S/p foot surgery left -Progressing as expected post-operatively.  Full path results are pending but I reviewed the intraoperative findings that this was consistent with a ganglion cyst -XR: None required -WB Status: Weightbearing as tolerated in surgical shoe -Sutures: Return in 1 week to remove. -Medications: No refills required - May bathe foot does not need bandaging can cover stitches with Band-Aid  Return in about 1 week (around 08/07/2024) for post op (no x-rays), suture removal.

## 2024-08-01 ENCOUNTER — Encounter: Admitting: Podiatry

## 2024-08-02 ENCOUNTER — Ambulatory Visit: Payer: Self-pay | Admitting: Podiatry

## 2024-08-02 ENCOUNTER — Encounter: Payer: Self-pay | Admitting: Podiatry

## 2024-08-07 ENCOUNTER — Ambulatory Visit (INDEPENDENT_AMBULATORY_CARE_PROVIDER_SITE_OTHER)

## 2024-08-07 DIAGNOSIS — M67472 Ganglion, left ankle and foot: Secondary | ICD-10-CM

## 2024-08-07 NOTE — Progress Notes (Signed)
 Patient presents for post-op visit today, POV #2 DOS 07/24/24 LT GREAT TOE CYST REMOVAL  Doing alright. Had some bleeding around sutures when he hit his sutures while at hospital with wife..  RN Notes: n/a  Vital Signs: Today's Vitals   08/07/24 0904  PainSc: 1   PainLoc: Foot      Radiographs: []  Taken [x]  Not taken  Surgical Site Assessment:  - Dressing:  []  Minimal dry blood, intact []  Reinforced   []  Changed     -RN Notes: no dressing in place  - Incision:  [x]  CDI (clean, dry, intact)  []  Mild erythema  []  Drainage noted   -RN Notes: no active or blood present  - Swelling:  []  None  [x]  Mild  []  Moderate   []  Significant     -RN Notes: n/a  - Bruising:  [x]  None  []  Present: n/a   - Sutures/Staples:  []  None [x]  Intact  [x]  Removed Today  []  Plan to remove at next visit   -Cast/Splint/Pins: [x]  None []  Intact []  Removed Today []  Plan to remove at next visit []  Replaced  -Signs of infection:  [x]  None  []  Present - Describe: n/a  -DME:    []  None []  AFW [x]  Surgical shoe []  Cast  []  Splint  -Walking status:  [x]  Full WB  []  Partial WB  []  NWB  -Utilizing device:  [x]  None []  Knee Scooter []  Crutches []  Wheelchair    DVT assessment:  [x]  Denies symptoms []  Chest pain/SOB []  Pain in calf/redness/warmth   Redressed DSD and ace wrap. Educated on signs of infection, proper dressing care, pain management, and weight bearing status. Patient will contact provider with any new or worsening symptoms. The provider assessed the patient today and reviewed instructions regarding plan of care.   Addendum: Surgical site c/d/I without signs of infection. No recurrence of cyst. Continue surgical shoe x 1 week while incision matures. WBAT. Okay to return to work 11/3. RTC 3-4 weeks for final check.  Prentice Ovens, DPM

## 2024-08-12 ENCOUNTER — Telehealth: Admitting: Family

## 2024-08-12 DIAGNOSIS — J301 Allergic rhinitis due to pollen: Secondary | ICD-10-CM

## 2024-08-13 MED ORDER — LEVOCETIRIZINE DIHYDROCHLORIDE 5 MG PO TABS: 5.0000 mg | ORAL_TABLET | Freq: Every evening | ORAL | 1 refills | Status: AC

## 2024-08-13 MED ORDER — FLUTICASONE PROPIONATE 50 MCG/ACT NA SUSP: 2.0000 | Freq: Every day | NASAL | 6 refills | Status: AC

## 2024-08-13 MED ORDER — OLOPATADINE HCL 0.2 % OP SOLN: 1.0000 [drp] | Freq: Every day | OPHTHALMIC | 2 refills | Status: AC | PRN

## 2024-08-13 NOTE — Progress Notes (Signed)
 E visit for Allergic Rhinitis We are sorry that you are not feeling well.  Here is how we plan to help!  Based on what you have shared with me it looks like you have Allergic Rhinitis.  Rhinitis is when a reaction occurs that causes nasal congestion, runny nose, sneezing, and itching.  Most types of rhinitis are caused by an inflammation and are associated with symptoms in the eyes ears or throat. There are several types of rhinitis.  The most common are acute rhinitis, which is usually caused by a viral illness, allergic or seasonal rhinitis, and nonallergic or year-round rhinitis.  Nasal allergies occur certain times of the year.  Allergic rhinitis is caused when allergens in the air trigger the release of histamine in the body.  Histamine causes itching, swelling, and fluid to build up in the fragile linings of the nasal passages, sinuses and eyelids.  An itchy nose and clear discharge are common.  I recommend the following over the counter treatments: You should take a daily dose of antihistamine and Xyzal 5 mg take 1 tablet daily  I also would recommend a nasal spray: Flonase 2 sprays into each nostril once daily  You may also benefit from eye drops such as: Pataday drops once a day.   HOME CARE:  You can use an over-the-counter saline nasal spray as needed Avoid areas where there is heavy dust, mites, or molds Stay indoors on windy days during the pollen season Keep windows closed in home, at least in bedroom; use air conditioner. Use high-efficiency house air filter Keep windows closed in car, turn AC on re-circulate Avoid playing out with dog during pollen season  GET HELP RIGHT AWAY IF:  If your symptoms do not improve within 10 days You become short of breath You develop yellow or green discharge from your nose for over 3 days You have coughing fits  MAKE SURE YOU:  Understand these instructions Will watch your condition Will get help right away if you are not doing well  or get worse  Thank you for choosing an e-visit. Your e-visit answers were reviewed by a board certified advanced clinical practitioner to complete your personal care plan. Depending upon the condition, your plan could have included both over the counter or prescription medications. Please review your pharmacy choice. Be sure that the pharmacy you have chosen is open so that you can pick up your prescription now.  If there is a problem you may message your provider in MyChart to have the prescription routed to another pharmacy. Your safety is important to us . If you have drug allergies check your prescription carefully.  For the next 24 hours, you can use MyChart to ask questions about today's visit, request a non-urgent call back, or ask for a work or school excuse from your e-visit provider. You will get an email in the next two days asking about your experience. I hope that your e-visit has been valuable and will speed your recovery.   I have spent 5 minutes in review of e-visit questionnaire, review and updating patient chart, medical decision making and response to patient.   Bari Learn, FNP

## 2024-08-15 ENCOUNTER — Encounter: Admitting: Podiatry

## 2024-08-29 ENCOUNTER — Encounter: Admitting: Podiatry

## 2024-09-05 ENCOUNTER — Encounter: Admitting: Podiatry
